# Patient Record
Sex: Male | Born: 1965 | Race: White | Hispanic: No | Smoking: Current every day smoker
Health system: Southern US, Community
[De-identification: ages and names within clinical notes are randomized; demographics above are authoritative.]

## PROBLEM LIST (undated history)

## (undated) DIAGNOSIS — N200 Calculus of kidney: Principal | ICD-10-CM

## (undated) DIAGNOSIS — I10 Essential (primary) hypertension: Secondary | ICD-10-CM

## (undated) DIAGNOSIS — Z87442 Personal history of urinary calculi: Secondary | ICD-10-CM

## (undated) HISTORY — PX: TIBIA FRACTURE SURGERY: SHX806

## (undated) HISTORY — PX: VASECTOMY: SHX75

## (undated) HISTORY — PX: APPENDECTOMY: SHX54

## (undated) HISTORY — DX: Calculus of kidney: N20.0

---

## 2003-11-03 ENCOUNTER — Emergency Department (HOSPITAL_COMMUNITY): Admission: EM | Admit: 2003-11-03 | Discharge: 2003-11-03 | Payer: Self-pay | Admitting: Emergency Medicine

## 2003-11-07 DIAGNOSIS — N200 Calculus of kidney: Secondary | ICD-10-CM

## 2003-11-07 HISTORY — DX: Calculus of kidney: N20.0

## 2003-12-14 ENCOUNTER — Emergency Department (HOSPITAL_COMMUNITY): Admission: EM | Admit: 2003-12-14 | Discharge: 2003-12-14 | Payer: Self-pay

## 2004-06-16 ENCOUNTER — Emergency Department (HOSPITAL_COMMUNITY): Admission: EM | Admit: 2004-06-16 | Discharge: 2004-06-16 | Payer: Self-pay | Admitting: Emergency Medicine

## 2009-06-11 ENCOUNTER — Ambulatory Visit (HOSPITAL_BASED_OUTPATIENT_CLINIC_OR_DEPARTMENT_OTHER): Admission: RE | Admit: 2009-06-11 | Discharge: 2009-06-11 | Payer: Self-pay | Admitting: Orthopedic Surgery

## 2011-02-11 LAB — POCT HEMOGLOBIN-HEMACUE: Hemoglobin: 14.7 g/dL (ref 13.0–17.0)

## 2011-03-21 NOTE — Op Note (Signed)
[name], Adam Mcguire                  ACCOUNT NO.:  [id number]   MEDICAL RECORD NO.:  [id number]          PATIENT TYPE:  AMB   LOCATION:  [facility]                         FACILITY:  [facility]   PHYSICIAN:  Marlowe Kays, M.D.  DATE OF BIRTH:  01-Dec-1965   DATE OF PROCEDURE:  06/11/2009  DATE OF DISCHARGE:                               OPERATIVE REPORT   PREOPERATIVE DIAGNOSES:  Torn medial meniscus, left knee.   POSTOPERATIVE DIAGNOSES:  1. Torn medial meniscus.  2. Multiple medial shelf plicae, left knee.   OPERATION:  1. Left knee arthroscopy with partial medial meniscectomy.  2. Resection of multiple suprapatellar plicae.   SURGEON:  Marlowe Kays, M.D.   ASSISTANT:  Nurse.   ANESTHESIA:  General.   PATHOLOGY AND JUSTIFICATION FOR PROCEDURE:  He injured his knee while  working for the [employer] on Mar 08, 2009 and an MRI of his left  knee performed on Mar 18, 2009, which showed a posterior horn tear of  the medial meniscus.  Because of some mechanical symptoms and pain, he  is here today for the above-mentioned surgery.   PROCEDURE:  Satisfactory anesthesia, Ace wrap and knee support to right  lower extremity.  Pneumatic tourniquet applied to left lower extremity  and the leg esmarched out nonsterilely and tourniquet inflated to 350  mmHg.  Thigh stabilizer applied.  Left leg was prepped with DuraPrep  from tourniquet to perform a stabilizer to ankle and draped in sterile  field.  Time-out performed.  Superior medial saline inflow first through  an anterolateral portal and medial compartment joint was evaluated and  this was completely normal.  Looking at the suprapatellar area, patella  looked unremarkable but he had three fairly significant plicae which I  pictured, sectioned with scissors with pictures of this being taken and  removed the remnants with 3.5 shaver.  On reversing portals in the  medial compartment knee joint, he had what may have been originally a  bucket-handle type tear with an intercondylar fragment and also a  disruption of the meniscus at the junction of the mid posterior third.  I resected most of the torn areas with baskets and then shaved the  remainder of the rim down with a 3.5 shaver until smooth on probing.  Final pictures were taken.  The joint was then irrigated to clear and  all fluid possible removed.  To close to anterior portals with 4-0 nylon  and then injected 20 mL 0.5% Marcaine with adrenaline and 4 mg of  morphine through the inflow apparatus which I removed  and then closed this portal with 4-0 nylon as well.  Betadine and  Adaptic dressing were applied.  He was given Toradol IV.  Tourniquet was  released.  He tolerated the procedure well and was taken to the recovery  room satisfactory this with no known complications.           ______________________________  Marlowe Kays, M.D.     JA/MEDQ  D:  06/11/2009  T:  06/12/2009  Job:  [id number]

## 2011-12-06 ENCOUNTER — Ambulatory Visit: Payer: 59

## 2011-12-06 ENCOUNTER — Ambulatory Visit (INDEPENDENT_AMBULATORY_CARE_PROVIDER_SITE_OTHER): Payer: 59 | Admitting: Family Medicine

## 2011-12-06 VITALS — BP 130/90 | HR 80 | Temp 98.1°F | Resp 24 | Ht 66.0 in | Wt 175.0 lb

## 2011-12-06 DIAGNOSIS — R300 Dysuria: Secondary | ICD-10-CM

## 2011-12-06 DIAGNOSIS — N200 Calculus of kidney: Secondary | ICD-10-CM | POA: Insufficient documentation

## 2011-12-06 LAB — POCT URINALYSIS DIPSTICK
Bilirubin, UA: NEGATIVE
Glucose, UA: NEGATIVE
Ketones, UA: NEGATIVE
Leukocytes, UA: NEGATIVE
Nitrite, UA: NEGATIVE
Protein, UA: NEGATIVE
Spec Grav, UA: 1.02
Urobilinogen, UA: 2
pH, UA: 7

## 2011-12-06 LAB — POCT UA - MICROSCOPIC ONLY
Casts, Ur, LPF, POC: NEGATIVE
Crystals, Ur, HPF, POC: NEGATIVE
Yeast, UA: NEGATIVE

## 2011-12-06 MED ORDER — HYDROMORPHONE HCL 2 MG PO TABS: 2.0000 mg | ORAL_TABLET | ORAL | Status: AC | PRN

## 2011-12-06 MED ORDER — KETOROLAC TROMETHAMINE 60 MG/2ML IM SOLN
60.0000 mg | Freq: Once | INTRAMUSCULAR | Status: AC
  Administered 2011-12-06: 60 mg via INTRAMUSCULAR

## 2011-12-06 MED ORDER — TAMSULOSIN HCL 0.4 MG PO CAPS: 0.4000 mg | ORAL_CAPSULE | Freq: Every day | ORAL | Status: DC

## 2011-12-06 NOTE — Progress Notes (Signed)
Quick Note:  Treating provider reviewed results. ______

## 2011-12-06 NOTE — Patient Instructions (Signed)
Kidney Stones Kidney stones (ureteral lithiasis) are deposits that form inside your kidneys. The intense pain is caused by the stone moving through the urinary tract. When the stone moves, the ureter goes into spasm around the stone. The stone is usually passed in the urine.  CAUSES   A disorder that makes certain neck glands produce too much parathyroid hormone (primary hyperparathyroidism).   A buildup of uric acid crystals.   Narrowing (stricture) of the ureter.   A kidney obstruction present at birth (congenital obstruction).   Previous surgery on the kidney or ureters.   Numerous kidney infections.  SYMPTOMS   Feeling sick to your stomach (nauseous).   Throwing up (vomiting).   Blood in the urine (hematuria).   Pain that usually spreads (radiates) to the groin.   Frequency or urgency of urination.  DIAGNOSIS   Taking a history and physical exam.   Blood or urine tests.   Computerized X-ray scan (CT scan).   Occasionally, an examination of the inside of the urinary bladder (cystoscopy) is performed.  TREATMENT   Observation.   Increasing your fluid intake.   Surgery may be needed if you have severe pain or persistent obstruction.  The size, location, and chemical composition are all important variables that will determine the proper choice of action for you. Talk to your caregiver to better understand your situation so that you will minimize the risk of injury to yourself and your kidney.  HOME CARE INSTRUCTIONS   Drink enough water and fluids to keep your urine clear or pale yellow.   Strain all urine through the provided strainer. Keep all particulate matter and stones for your caregiver to see. The stone causing the pain may be as small as a grain of salt. It is very important to use the strainer each and every time you pass your urine. The collection of your stone will allow your caregiver to analyze it and verify that a stone has actually passed.   Only take  over-the-counter or prescription medicines for pain, discomfort, or fever as directed by your caregiver.   Make a follow-up appointment with your caregiver as directed.   Get follow-up X-rays if required. The absence of pain does not always mean that the stone has passed. It may have only stopped moving. If the urine remains completely obstructed, it can cause loss of kidney function or even complete destruction of the kidney. It is your responsibility to make sure X-rays and follow-ups are completed. Ultrasounds of the kidney can show blockages and the status of the kidney. Ultrasounds are not associated with any radiation and can be performed easily in a matter of minutes.  SEEK IMMEDIATE MEDICAL CARE IF:   Pain cannot be controlled with the prescribed medicine.   You have a fever.   The severity or intensity of pain increases over 18 hours and is not relieved by pain medicine.   You develop a new onset of abdominal pain.   You feel faint or pass out.  MAKE SURE YOU:   Understand these instructions.   Will watch your condition.   Will get help right away if you are not doing well or get worse.  Document Released: 10/23/2005 Document Revised: 07/05/2011 Document Reviewed: 02/18/2010 ExitCare Patient Information 2012 [employer].Ureteral Colic Ureteral colic is spasm-like pain from the kidney or the ureter. This is often caused by a kidney stone. The pain is caused by the stone trying to get through the tubes that pass your pee. HOME   CARE   Drink enough fluids to keep your pee (urine) clear or pale yellow.   Strain all your pee. A strainer will be provided. Keep anything caught in the strainer and bring it to your doctor. The stone causing the pain may be very small.   Only take medicine as told by your doctor.   Follow up with your doctor as told.  GET HELP RIGHT AWAY IF:   Pain is not controlled with medicine.   Pain continues or gets worse.   The pain changes and there  is chest or belly (abdominal) pain.   You pass out (faint).   You cannot pee.   You keep throwing up (vomiting).   You have a temperature by mouth above 102 F (38.9 C), not controlled by medicine.  MAKE SURE YOU:   Understand these instructions.   Will watch this condition.   Will get help right away if you are not doing well or get worse.  Document Released: 04/10/2008 Document Revised: 07/05/2011 Document Reviewed: 04/10/2008 ExitCare Patient Information 2012 [employer]. 

## 2011-12-06 NOTE — Progress Notes (Signed)
This is a 46 yo [employer] [address] employee with acute right flank pain thought to be a kidney stone since he's had this before.  Pain began 3 days ago and has persisted, much worse today.  Couldn't put shoes on this am.  No nausea or vomiting.  Lost appetite.  Took some Bayer aspirin but it hasn't helped.  Had prior stone in about 2005.  Seen by [facility].  O: Pt lying in obvious pain on right side.  Alert and cooperative.  Positive SLR both legs while supine.  Mild right CVA tenderness.  Nontender abdomen.   KUB negative.  U/A shows 10-15 rbc/hpf  A:  Kidney stone, acute\ P:  F/u [facility]  Dilaudid for pain  Flomax  Urine strainer provided.

## 2014-05-19 ENCOUNTER — Emergency Department (HOSPITAL_COMMUNITY): Payer: Worker's Compensation

## 2014-05-19 ENCOUNTER — Emergency Department (HOSPITAL_COMMUNITY)
Admission: EM | Admit: 2014-05-19 | Discharge: 2014-05-19 | Disposition: A | Discharge status: Home / Self Care | Payer: Worker's Compensation | Attending: Emergency Medicine | Admitting: Emergency Medicine

## 2014-05-19 ENCOUNTER — Encounter (HOSPITAL_COMMUNITY): Payer: Self-pay | Admitting: Emergency Medicine

## 2014-05-19 DIAGNOSIS — S61209A Unspecified open wound of unspecified finger without damage to nail, initial encounter: Secondary | ICD-10-CM | POA: Insufficient documentation

## 2014-05-19 DIAGNOSIS — Z7982 Long term (current) use of aspirin: Secondary | ICD-10-CM | POA: Insufficient documentation

## 2014-05-19 DIAGNOSIS — Y929 Unspecified place or not applicable: Secondary | ICD-10-CM | POA: Insufficient documentation

## 2014-05-19 DIAGNOSIS — W268XXA Contact with other sharp object(s), not elsewhere classified, initial encounter: Secondary | ICD-10-CM | POA: Insufficient documentation

## 2014-05-19 DIAGNOSIS — S61219A Laceration without foreign body of unspecified finger without damage to nail, initial encounter: Secondary | ICD-10-CM

## 2014-05-19 DIAGNOSIS — Y9389 Activity, other specified: Secondary | ICD-10-CM | POA: Insufficient documentation

## 2014-05-19 DIAGNOSIS — F17200 Nicotine dependence, unspecified, uncomplicated: Secondary | ICD-10-CM | POA: Insufficient documentation

## 2014-05-19 DIAGNOSIS — Z87442 Personal history of urinary calculi: Secondary | ICD-10-CM | POA: Insufficient documentation

## 2014-05-19 DIAGNOSIS — Z79899 Other long term (current) drug therapy: Secondary | ICD-10-CM | POA: Insufficient documentation

## 2014-05-19 MED ORDER — OXYCODONE-ACETAMINOPHEN 5-325 MG PO TABS: 1.0000 | ORAL_TABLET | Freq: Four times a day (QID) | ORAL | Status: DC | PRN

## 2014-05-19 NOTE — Progress Notes (Signed)
Orthopedic Tech Progress Note Patient Details:  Adam JanskyRandy L Kalis 06/02/1966 [id number]  Ortho Devices Type of Ortho Device: Finger splint Ortho Device/Splint Location: lue Ortho Device/Splint Interventions: Application   Terrence Pizana 05/19/2014, 6:07 PM

## 2014-05-19 NOTE — ED Provider Notes (Signed)
CSN: [id number]     Arrival date & time 05/19/14  1509 History  This chart was [id number] for non-physician practitioner, Allen DerryMercedes Camprubi-Soms PA-C working with [name], MD by Arlan OrganAshley Leger, ED scribe. This patient was seen in room TR08C/TR08C and the patient's care was started at 4:22 PM.   Chief Complaint  Patient presents with  . Laceration   Patient is a 48 y.o. male presenting with skin laceration. The history is provided by the patient. No language interpreter was used.  Laceration Location:  Finger Finger laceration location:  L index finger Length (cm):  3 Depth:  Cutaneous Quality: jagged   Bleeding: controlled   Time since incident:  3 hours Laceration mechanism:  Metal edge Pain details:    Quality:  Throbbing   Severity:  Moderate   Timing:  Constant   Progression:  Unchanged Foreign body present:  No foreign bodies Relieved by:  Certain positions Worsened by:  Movement Tetanus status:  Up to date  HPI Comments: Adam JanskyRandy L Mcguire is a 48 y.o. male who presents to the Emergency Department complaining of a laceration to his L 2nd digit sustained earlier today around 1:30 PM. Pt states he was punching a hole in something resulting in him cutting his finger on a piece of stainless steel. He describes associated pain as throbbing, moderate, non-radiating.  This pain is exacerbated with movement. He has attempted to hold his hand over his head to help alleviate discomfort. No pain medications used to improve symptoms. Bleeding controlled with pressure PTA. At this time he denies any fever, chills, numbness, loss of sensation, or paresthesia. Pt Tetanus UTD by urgent care earlier today. He is otherwise healthy without any medical problems. No other concerns this visit. Works with his hands, has 3 jobs.  Past Medical History  Diagnosis Date  . Kidney stone 2005   Past Surgical History  Procedure Laterality Date  . Appendectomy     No family history on file. History   Substance Use Topics  . Smoking status: Current Every Day Smoker -- 0.50 packs/day for 30 years    Types: Cigarettes  . Smokeless tobacco: Not on file  . Alcohol Use: Not on file    Review of Systems  Constitutional: Negative for fever and chills.  Skin: Positive for wound (Laceration to L 2nd digit). Negative for color change.  Neurological: Negative for weakness and numbness.   Allergies  Vicodin  Home Medications   Prior to Admission medications   Medication Sig Start Date End Date Taking? Authorizing Provider  aspirin 500 MG buffered tablet Take 500 mg by mouth every 6 (six) hours as needed for pain.    Yes Historical Provider, MD  Tamsulosin HCl (FLOMAX) 0.4 MG CAPS Take 1 capsule (0.4 mg total) by mouth daily. 12/06/11  Yes Elvina SidleKurt Lauenstein, MD  oxyCODONE-acetaminophen (PERCOCET) 5-325 MG per tablet Take 1-2 tablets by mouth every 6 (six) hours as needed for severe pain. 05/19/14   Kutter Schnepf Strupp Camprubi-Soms, PA-C   Triage Vitals: BP 135/88  Pulse 60  Temp(Src) 98 F (36.7 C) (Oral)  Resp 18  Ht 5\' 7"  (1.702 m)  Wt 179 lb (81.194 kg)  BMI 28.03 kg/m2  SpO2 99%    Physical Exam  Nursing note and vitals reviewed. Constitutional: He is oriented to person, place, and time. Vital signs are normal. He appears well-developed and well-nourished.  Afebrile, NAD  HENT:  Head: Normocephalic and atraumatic.  Mouth/Throat: Mucous membranes are normal.  Eyes: Conjunctivae, EOM  and lids are normal.  Neck: Normal range of motion. Neck supple.  Cardiovascular: Normal rate and intact distal pulses.   Distal pulses intact in all extremities, cap refill <3 sec in all digits including L index finger  Pulmonary/Chest: Effort normal.  Abdominal: Normal appearance. He exhibits no distension.  Musculoskeletal: Normal range of motion.  ROM intact in all joints of BUEs. MCP, PIP, and DIP joints with FROM. Neurovascularly intact in BUEs and all digits. No bony TTP in L index finger.   Neurological: He is alert and oriented to person, place, and time. He has normal strength. No sensory deficit.  Sensation intact in all extremities and digits, strength 5/5 in all extremities. ROM intact. Good cap refill and pulses  Skin: Skin is warm and dry. Laceration noted.  5cm jagged Lac to L index finger tip with flap slightly pale, bleeding well controlled. Neurovascularly intact distal tip.   Psychiatric: He has a normal mood and affect.    ED Course  LACERATION REPAIR Date/Time: 05/20/2014 5:15 PM Performed by: CAMPRUBI-SOMS, Tivon Lemoine STRUPP Authorized by: Ramond Marrow Consent: Verbal consent obtained. Risks and benefits: risks, benefits and alternatives were discussed Consent given by: patient Patient understanding: patient states understanding of the procedure being performed Patient consent: the patient's understanding of the procedure matches consent given Patient identity confirmed: verbally with patient Body area: upper extremity Location details: left index finger Laceration length: 5 cm Foreign bodies: no foreign bodies Tendon involvement: none Nerve involvement: none Vascular damage: no Anesthesia: local infiltration Local anesthetic: lidocaine 2% without epinephrine Anesthetic total: 1.5 ml Patient sedated: no Preparation: Patient was prepped and draped in the usual sterile fashion. Irrigation solution: saline Irrigation method: jet lavage Amount of cleaning: standard Debridement: none Degree of undermining: none Skin closure: 4-0 Prolene Number of sutures: 7 Technique: simple Approximation: close Approximation difficulty: simple Dressing: 4x4 sterile gauze, antibiotic ointment and splint Patient tolerance: Patient tolerated the procedure well with no immediate complications.   (including critical care time)  DIAGNOSTIC STUDIES: Oxygen Saturation is 99% on RA, normal by my interpretation.    COORDINATION OF CARE: 4:27 PM- Will  order DG Finger Index L. Discussed treatment plan with pt at bedside and pt agreed to plan.    5:15 PM- Will perform laceration repair.   Labs Review Labs Reviewed - No data to display  Imaging Review Dg Finger Index Left  05/19/2014   CLINICAL DATA:  Recent injury with skin laceration  EXAM: LEFT INDEX FINGER 2+V  COMPARISON:  None.  FINDINGS: Soft tissue injury is noted consistent with the given clinical history. The underlying bony structures are within normal limits. No radiopaque foreign body is seen.  IMPRESSION: Soft tissue injury without acute bony abnormality.   Electronically Signed   By: Alcide Clever M.D.   On: 05/19/2014 17:01     EKG Interpretation None      MDM   Final diagnoses:  Finger laceration, initial encounter    Adam Mcguire is a 48 y.o. male with no significant PMHx presenting after cutting L index finger tip with metallic object at work. Seen by UC and given tetanus, sent to [place] for further eval and suturing. Xray neg, neurovascularly intact, no tendon involvement, ROM intact in all joints. Sutured with 4-0 prolene, applied dressing and finger tip splint to help with pain relief and avoid injury at work given that pt states he works with his hands and will not likely stay out of work regardless of being given a  note. Discussed suture care, signs of infection, and returning to UC or PCP in 7 days for suture removal. Rx for pain meds, no need for abx at this time but given strict return precautions. I explained the diagnosis and have given explicit precautions to return to the ER including for any other new or worsening symptoms. The patient understands and accepts the medical plan as it's been dictated and I have answered their questions. Discharge instructions concerning home care and prescriptions have been given. The patient is STABLE and is discharged to home in good condition.  I personally performed the services described in this documentation, which was scribed  in my presence. The recorded information has been reviewed and is accurate.  BP 135/88  Pulse 60  Temp(Src) 98 F (36.7 C) (Oral)  Resp 18  Ht 5\' 7"  (1.702 m)  Wt 179 lb (81.194 kg)  BMI 28.03 kg/m2  SpO2 99%   [employer], PA-C 05/20/14 0354

## 2014-05-19 NOTE — ED Notes (Signed)
Pt has sm. Flap laceration to left index finger.  No bleeding at this time

## 2014-05-19 NOTE — ED Notes (Signed)
Finger laceration lt. Index finger tip. Bleeding controlled.

## 2014-05-19 NOTE — Discharge Instructions (Signed)
Keep wound and clean with mild soap and water. Keep area covered with a topical antibiotic ointment and bandage, keep bandage dry, and do not submerge in water for 24 hours. Ice and elevate for additional pain relief and swelling. Alternate between Ibuprofen and Percocet for additional pain relief. Follow up with your primary care doctor or the [facility] in approximately 7 days for wound recheck and suture removal. Monitor area for signs of infection to include, but not limited to: increasing pain, redness, drainage/pus, or swelling. Return to emergency department for emergent changing or worsening symptoms.   Fingertip Injuries and Amputations Fingertip injuries are common and often get injured because they are last to escape when pulling your hand out of harm's way. You have amputated (cut off) part of your finger. How this turns out depends largely on how much was amputated. If just the tip is amputated, often the end of the finger will grow back and the finger may return to much the same as it was before the injury.  If more of the finger is missing, your caregiver has done the best with the tissue remaining to allow you to keep as much finger as is possible. Your caregiver after checking your injury has tried to leave you with a painless fingertip that has durable, feeling skin. If possible, your caregiver has tried to maintain the finger's length and appearance and preserve its fingernail.  Please read the instructions outlined below and refer to this sheet in the next few weeks. These instructions provide you with general information on caring for yourself. Your caregiver may also give you specific instructions. While your treatment has been done according to the most current medical practices available, unavoidable complications occasionally occur. If you have any problems or questions after discharge, please call your caregiver. HOME CARE INSTRUCTIONS   You may resume normal diet  and activities as directed or allowed.  Keep your hand elevated above the level of your heart. This helps decrease pain and swelling.  Keep ice packs (or a bag of ice wrapped in a towel) on the injured area for 15-20 minutes, 03-04 times per day, for the first two days.  Change dressings if necessary or as directed.  Clean the wound daily or as directed.  Only take over-the-counter or prescription medicines for pain, discomfort, or fever as directed by your caregiver.  Keep appointments as directed. SEEK IMMEDIATE MEDICAL CARE IF:  You develop redness, swelling, numbness or increasing pain in the wound.  There is pus coming from the wound.  You develop an unexplained oral temperature above 102 F (38.9 C) or as your caregiver suggests.  There is a foul (bad) smell coming from the wound or dressing.  There is a breaking open of the wound (edges not staying together) after sutures or staples have been removed. MAKE SURE YOU:   Understand these instructions.  Will watch your condition.  Will get help right away if you are not doing well or get worse. Document Released: 09/13/2005 Document Revised: 01/15/2012 Document Reviewed: 08/12/2008 [facility] Patient Information 2015 [place], [place]. This information is not intended to replace advice given to you by your health care provider. Make sure you discuss any questions you have with your health care provider.  Laceration Care, Adult A laceration is a cut or lesion that goes through all layers of the skin and into the tissue just beneath the skin. TREATMENT  Some lacerations may not require closure. Some lacerations may not be  able to be closed due to an increased risk of infection. It is important to see your caregiver as soon as possible after an injury to minimize the risk of infection and maximize the opportunity for successful closure. If closure is appropriate, pain medicines may be given, if needed. The wound will be cleaned to  help prevent infection. Your caregiver will use stitches (sutures), staples, wound glue (adhesive), or skin adhesive strips to repair the laceration. These tools bring the skin edges together to allow for faster healing and a better cosmetic outcome. However, all wounds will heal with a scar. Once the wound has healed, scarring can be minimized by covering the wound with sunscreen during the day for 1 full year. HOME CARE INSTRUCTIONS  For sutures or staples:  Keep the wound clean and dry.  If you were given a bandage (dressing), you should change it at least once a day. Also, change the dressing if it becomes wet or dirty, or as directed by your caregiver.  Wash the wound with soap and water 2 times a day. Rinse the wound off with water to remove all soap. Pat the wound dry with a clean towel.  After cleaning, apply a thin layer of the antibiotic ointment as recommended by your caregiver. This will help prevent infection and keep the dressing from sticking.  You may shower as usual after the first 24 hours. Do not soak the wound in water until the sutures are removed.  Only take over-the-counter or prescription medicines for pain, discomfort, or fever as directed by your caregiver.  Get your sutures or staples removed as directed by your caregiver. For skin adhesive strips:  Keep the wound clean and dry.  Do not get the skin adhesive strips wet. You may bathe carefully, using caution to keep the wound dry.  If the wound gets wet, pat it dry with a clean towel.  Skin adhesive strips will fall off on their own. You may trim the strips as the wound heals. Do not remove skin adhesive strips that are still stuck to the wound. They will fall off in time. For wound adhesive:  You may briefly wet your wound in the shower or bath. Do not soak or scrub the wound. Do not swim. Avoid periods of heavy perspiration until the skin adhesive has fallen off on its own. After showering or bathing, gently  pat the wound dry with a clean towel.  Do not apply liquid medicine, cream medicine, or ointment medicine to your wound while the skin adhesive is in place. This may loosen the film before your wound is healed.  If a dressing is placed over the wound, be careful not to apply tape directly over the skin adhesive. This may cause the adhesive to be pulled off before the wound is healed.  Avoid prolonged exposure to sunlight or tanning lamps while the skin adhesive is in place. Exposure to ultraviolet light in the first year will darken the scar.  The skin adhesive will usually remain in place for 5 to 10 days, then naturally fall off the skin. Do not pick at the adhesive film. You may need a tetanus shot if:  You cannot remember when you had your last tetanus shot.  You have never had a tetanus shot. If you get a tetanus shot, your arm may swell, get red, and feel warm to the touch. This is common and not a problem. If you need a tetanus shot and you choose not to have  one, there is a rare chance of getting tetanus. Sickness from tetanus can be serious. SEEK MEDICAL CARE IF:   You have redness, swelling, or increasing pain in the wound.  You see a red line that goes away from the wound.  You have yellowish-white fluid (pus) coming from the wound.  You have a fever.  You notice a bad smell coming from the wound or dressing.  Your wound breaks open before or after sutures have been removed.  You notice something coming out of the wound such as wood or glass.  Your wound is on your hand or foot and you cannot move a finger or toe. SEEK IMMEDIATE MEDICAL CARE IF:   Your pain is not controlled with prescribed medicine.  You have severe swelling around the wound causing pain and numbness or a change in color in your arm, hand, leg, or foot.  Your wound splits open and starts bleeding.  You have worsening numbness, weakness, or loss of function of any joint around or beyond the  wound.  You develop painful lumps near the wound or on the skin anywhere on your body. MAKE SURE YOU:   Understand these instructions.  Will watch your condition.  Will get help right away if you are not doing well or get worse. Document Released: 10/23/2005 Document Revised: 01/15/2012 Document Reviewed: 04/18/2011 [facility] Patient Information 2015 [place], [place]. This information is not intended to replace advice given to you by your health care provider. Make sure you discuss any questions you have with your health care provider.

## 2014-05-23 NOTE — ED Provider Notes (Signed)
Medical screening examination/treatment/procedure(s) were performed by non-physician practitioner and as supervising physician I was immediately available for consultation/collaboration.   EKG Interpretation None        Adam Mcguire CoKevin M Nicholai Willette, MD 05/23/14 [phone]

## 2018-08-20 ENCOUNTER — Emergency Department (HOSPITAL_COMMUNITY)
Admission: EM | Admit: 2018-08-20 | Discharge: 2018-08-20 | Disposition: A | Discharge status: Home / Self Care | Payer: 59 | Attending: Emergency Medicine | Admitting: Emergency Medicine

## 2018-08-20 ENCOUNTER — Emergency Department (HOSPITAL_COMMUNITY): Payer: 59

## 2018-08-20 ENCOUNTER — Encounter (HOSPITAL_COMMUNITY): Payer: Self-pay | Admitting: Emergency Medicine

## 2018-08-20 ENCOUNTER — Other Ambulatory Visit: Payer: Self-pay

## 2018-08-20 DIAGNOSIS — R110 Nausea: Secondary | ICD-10-CM | POA: Diagnosis not present

## 2018-08-20 DIAGNOSIS — Z7982 Long term (current) use of aspirin: Secondary | ICD-10-CM | POA: Insufficient documentation

## 2018-08-20 DIAGNOSIS — Z733 Stress, not elsewhere classified: Secondary | ICD-10-CM | POA: Insufficient documentation

## 2018-08-20 DIAGNOSIS — R079 Chest pain, unspecified: Secondary | ICD-10-CM | POA: Diagnosis present

## 2018-08-20 DIAGNOSIS — R002 Palpitations: Secondary | ICD-10-CM

## 2018-08-20 DIAGNOSIS — R911 Solitary pulmonary nodule: Secondary | ICD-10-CM

## 2018-08-20 DIAGNOSIS — F17210 Nicotine dependence, cigarettes, uncomplicated: Secondary | ICD-10-CM | POA: Insufficient documentation

## 2018-08-20 LAB — CBC WITH DIFFERENTIAL/PLATELET
Abs Immature Granulocytes: 0.01 10*3/uL (ref 0.00–0.07)
Basophils Absolute: 0.1 10*3/uL (ref 0.0–0.1)
Basophils Relative: 1 %
Eosinophils Absolute: 0.5 10*3/uL (ref 0.0–0.5)
Eosinophils Relative: 6 %
HCT: 42.8 % (ref 39.0–52.0)
Hemoglobin: 13.6 g/dL (ref 13.0–17.0)
Immature Granulocytes: 0 %
Lymphocytes Relative: 33 %
Lymphs Abs: 2.6 10*3/uL (ref 0.7–4.0)
MCH: 26.2 pg (ref 26.0–34.0)
MCHC: 31.8 g/dL (ref 30.0–36.0)
MCV: 82.5 fL (ref 80.0–100.0)
Monocytes Absolute: 0.5 10*3/uL (ref 0.1–1.0)
Monocytes Relative: 6 %
Neutro Abs: 4.2 10*3/uL (ref 1.7–7.7)
Neutrophils Relative %: 54 %
Platelets: 229 10*3/uL (ref 150–400)
RBC: 5.19 MIL/uL (ref 4.22–5.81)
RDW: 13.9 % (ref 11.5–15.5)
WBC: 7.8 10*3/uL (ref 4.0–10.5)
nRBC: 0 % (ref 0.0–0.2)

## 2018-08-20 LAB — COMPREHENSIVE METABOLIC PANEL
ALT: 23 U/L (ref 0–44)
AST: 21 U/L (ref 15–41)
Albumin: 4.1 g/dL (ref 3.5–5.0)
Alkaline Phosphatase: 54 U/L (ref 38–126)
Anion gap: 7 (ref 5–15)
BUN: 12 mg/dL (ref 6–20)
CO2: 26 mmol/L (ref 22–32)
Calcium: 9.5 mg/dL (ref 8.9–10.3)
Chloride: 107 mmol/L (ref 98–111)
Creatinine, Ser: 1.19 mg/dL (ref 0.61–1.24)
GFR calc Af Amer: >60 mL/min (ref >60)
GFR calc non Af Amer: >60 mL/min (ref >60)
Glucose, Bld: 117 mg/dL — ABNORMAL HIGH (ref 70–99)
Potassium: 3.7 mmol/L (ref 3.5–5.1)
Sodium: 140 mmol/L (ref 135–145)
Total Bilirubin: 0.5 mg/dL (ref 0.3–1.2)
Total Protein: 7.1 g/dL (ref 6.5–8.1)

## 2018-08-20 LAB — I-STAT TROPONIN, ED
Troponin i, poc: 0 ng/mL (ref 0.00–0.08)
Troponin i, poc: 0 ng/mL (ref 0.00–0.08)

## 2018-08-20 MED ORDER — IOPAMIDOL (ISOVUE-370) INJECTION 76%
INTRAVENOUS | Status: AC
  Filled 2018-08-20: qty 100

## 2018-08-20 MED ORDER — IOPAMIDOL (ISOVUE-370) INJECTION 76%
100.0000 mL | Freq: Once | INTRAVENOUS | Status: AC | PRN
  Administered 2018-08-20: 100 mL via INTRAVENOUS

## 2018-08-20 NOTE — ED Provider Notes (Signed)
[facility] EMERGENCY DEPARTMENT Provider Note   CSN: [id number] Arrival date & time: 08/20/18  1453     History   Chief Complaint Chief Complaint  Patient presents with  . Chest Pain    HPI Adam Mcguire is a 52 y.o. male.  The history is provided by the patient.  Chest Pain   This is a new problem. The current episode started 3 to 5 hours ago. The problem has been resolved. The pain is associated with eating. The pain is present in the substernal region. The pain is at a severity of 3/10. The pain is mild. Quality: palpitations. The pain does not radiate. Associated symptoms include nausea and palpitations. Pertinent negatives include no abdominal pain, no back pain, no cough, no exertional chest pressure, no fever, no hemoptysis, no leg pain, no malaise/fatigue, no near-syncope, no numbness, no orthopnea, no PND, no shortness of breath, no syncope, no vomiting and no weakness. He has tried rest for the symptoms.  Pertinent negatives for past medical history include no seizures.    Past Medical History:  Diagnosis Date  . Kidney stone 2005    Patient Active Problem List   Diagnosis Date Noted  . Kidney stone 12/06/2011    Past Surgical History:  Procedure Laterality Date  . APPENDECTOMY          Home Medications    Prior to Admission medications   Medication Sig Start Date End Date Taking? Authorizing Provider  aspirin 325 MG tablet Take 325 mg by mouth once.   Yes [provider]  oxyCODONE-acetaminophen (PERCOCET) 5-325 MG per tablet Take 1-2 tablets by mouth every 6 (six) hours as needed for severe pain. Patient not taking: Reported on 08/20/2018 05/19/14   Street, [employer], PA-C  Tamsulosin HCl (FLOMAX) 0.4 MG CAPS Take 1 capsule (0.4 mg total) by mouth daily. Patient not taking: Reported on 08/20/2018 12/06/11   Elvina Sidle, MD    Family History No family history on file.  Social History Social History   Tobacco Use  .  Smoking status: Current Every Day Smoker    Packs/day: 0.50    Years: 30.00    Pack years: 15.00    Types: Cigarettes  . Smokeless tobacco: Never Used  Substance Use Topics  . Alcohol use: Never    Frequency: Never  . Drug use: Never     Allergies   Vicodin [hydrocodone-acetaminophen]   Review of Systems Review of Systems  Constitutional: Negative for chills, fever and malaise/fatigue.  HENT: Negative for ear pain and sore throat.   Eyes: Negative for pain and visual disturbance.  Respiratory: Negative for cough, hemoptysis and shortness of breath.   Cardiovascular: Positive for chest pain and palpitations. Negative for orthopnea, syncope, PND and near-syncope.  Gastrointestinal: Positive for nausea. Negative for abdominal pain and vomiting.  Genitourinary: Negative for dysuria and hematuria.  Musculoskeletal: Negative for arthralgias and back pain.  Skin: Negative for color change and rash.  Neurological: Negative for seizures, syncope, weakness and numbness.  Psychiatric/Behavioral: The patient is nervous/anxious.   All other systems reviewed and are negative.    Physical Exam Updated Vital Signs  ED Triage Vitals  Enc Vitals Group     BP 08/20/18 1514 (!) 170/92     Pulse Rate 08/20/18 1514 61     Resp 08/20/18 1514 16     Temp 08/20/18 1514 98.7 F (37.1 C)     Temp Source 08/20/18 1514 Oral     SpO2  08/20/18 1514 98 %     Weight 08/20/18 1516 185 lb (83.9 kg)     Height 08/20/18 1516 5' 6.5" (1.689 m)     Head Circumference --      Peak Flow --      Pain Score 08/20/18 1515 2     Pain Loc --      Pain Edu? --      Excl. in GC? --     Physical Exam  Constitutional: He is oriented to person, place, and time. He appears well-developed and well-nourished.  HENT:  Head: Normocephalic and atraumatic.  Eyes: Pupils are equal, round, and reactive to light. Conjunctivae and EOM are normal.  Neck: Normal range of motion. Neck supple.  Cardiovascular: Normal  rate, regular rhythm, intact distal pulses and normal pulses.  No murmur heard. Pulmonary/Chest: Effort normal and breath sounds normal. No respiratory distress. He has no decreased breath sounds. He has no wheezes. He has no rhonchi. He has no rales.  Abdominal: Soft. Bowel sounds are normal. There is no tenderness.  Musculoskeletal: He exhibits no edema.       Right lower leg: He exhibits no edema.       Left lower leg: He exhibits no edema.  Neurological: He is alert and oriented to person, place, and time. No cranial nerve deficit.  Skin: Skin is warm and dry. Capillary refill takes less than 2 seconds.  Psychiatric: His mood appears anxious.  Nursing note and vitals reviewed.    ED Treatments / Results  Labs (all labs ordered are listed, but only abnormal results are displayed) Labs Reviewed  COMPREHENSIVE METABOLIC PANEL - Abnormal; Notable for the following components:      Result Value   Glucose, Bld 117 (*)    All other components within normal limits  CBC WITH DIFFERENTIAL/PLATELET  I-STAT TROPONIN, ED  I-STAT TROPONIN, ED    EKG EKG Interpretation  Date/Time:  Tuesday August 20 2018 14:59:08 EDT Ventricular Rate:  67 PR Interval:  132 QRS Duration: 90 QT Interval:  396 QTC Calculation: 418 R Axis:   60 Text Interpretation:  Normal sinus rhythm with sinus arrhythmia Normal ECG Confirmed by Virgina Norfolk [phone]) on 08/20/2018 4:41:15 PM   Radiology Dg Chest 2 View  Result Date: 08/20/2018 CLINICAL DATA:  Chest pain. EXAM: CHEST - 2 VIEW COMPARISON:  None. FINDINGS: The heart size and mediastinal contours are within normal limits. No pneumothorax or pleural effusion is noted. Right lung is clear. Rounded nodular density is seen projected over left midlung. The visualized skeletal structures are unremarkable. IMPRESSION: Rounded nodular density seen projected over left midlung. It is uncertain if this represents overlying lead marker or potentially pulmonary mass  or neoplasm. Repeat radiograph after removal of all markers is recommended, or potentially CT scan of the chest for further evaluation. Electronically Signed   By: Lupita Raider, M.D.   On: 08/20/2018 16:28   Ct Angio Chest Pe W And/or Wo Contrast  Result Date: 08/20/2018 CLINICAL DATA:  Left-sided chest pain. EXAM: CT ANGIOGRAPHY CHEST WITH CONTRAST TECHNIQUE: Multidetector CT imaging of the chest was performed using the standard protocol during bolus administration of intravenous contrast. Multiplanar CT image reconstructions and MIPs were obtained to evaluate the vascular anatomy. CONTRAST:  ISOVUE-370 IOPAMIDOL (ISOVUE-370) INJECTION 76% COMPARISON:  Chest x-ray from same day. FINDINGS: Cardiovascular: Satisfactory opacification of the pulmonary arteries to the segmental level. No evidence of pulmonary embolism. Borderline cardiomegaly. No pericardial effusion. Normal caliber thoracic aorta.  Calcific atherosclerosis of the left anterior descending coronary artery. Mediastinum/Nodes: No enlarged mediastinal, hilar, or axillary lymph nodes. Prominent bilateral subcentimeter hilar lymph nodes measuring up to 10 mm in short axis are likely reactive. Thyroid gland, trachea, and esophagus demonstrate no significant findings. Lungs/Pleura: Diffuse moderate peribronchial thickening. No focal consolidation, pleural effusion, or pneumothorax. 3 mm pulmonary nodule in the right upper lobe (series 6, image 34). Upper Abdomen: No acute abnormality. Musculoskeletal: No chest wall abnormality. No acute or significant osseous findings. Mild chronic wedging of several thoracic vertebral bodies. Review of the MIP images confirms the above findings. IMPRESSION: 1.  No evidence of pulmonary embolism. 2. Diffuse moderate peribronchial thickening, likely smoking-related. 3. 3 mm right upper lobe pulmonary nodule. No follow-up needed if patient is low-risk. Non-contrast chest CT can be considered in 12 months if patient  is high-risk. This recommendation follows the consensus statement: Guidelines for Management of Incidental Pulmonary Nodules Detected on CT Images: From the Fleischner Society 2017; Radiology 2017; 284:228-243. Electronically Signed   By: Obie Dredge M.D.   On: 08/20/2018 18:28    Procedures Procedures (including critical care time)  Medications Ordered in ED Medications  iopamidol (ISOVUE-370) 76 % injection (has no administration in time range)  iopamidol (ISOVUE-370) 76 % injection 100 mL (100 mLs Intravenous Contrast Given 08/20/18 1754)     Initial Impression / Assessment and Plan / ED Course  I have reviewed the triage vital signs and the nursing notes.  Pertinent labs & imaging results that were available during my care of the patient were reviewed by me and considered in my medical decision making (see chart for details).     Adam Mcguire is a 52 year old male with no significant medical problems who presents to the ED with chest pain, palpitations.  Patient with unremarkable vitals.  No fever.  Patient mostly describing palpitations for the last several hours.  States that has been on and off for the last several days.  Denies any true chest pain upon further discussion.  No shortness of breath.  Has felt a lot of anxiety and stress from his job recently.  Has no significant family history of coronary artery disease.  No DVT or PE risk factors.  Doubt PE.  Patient is overall well-appearing.  EKG shows sinus rhythm with no signs of ischemic changes.  Patient with overall unremarkable exam.  Clear breath sounds.  No signs of volume overload.  No obvious murmur.  Chest x-ray showed possible pulmonary nodule.  No pneumonia, no pneumothorax, no pleural effusion.  CT PE study was ordered to evaluate for pulmonary nodule and to further elucidate any PE.  Patient otherwise with no significant anemia, electrolyte abnormality, kidney injury.  Troponins negative x2.  PET scan showed no acute PE  but did show a 3 mm pulmonary nodule.  Suspect patient likely suffering from palpitations, possibly PVCs.  Recommend that he follow-up with primary care doctor for likely heart monitor.  We will also need CT scan of his chest in 1 year to further evaluate pulmonary nodule.  He is aware of these findings.  Doubt acute coronary process at this time.  Given return precautions.  Discharged from ED in good condition.  This chart was dictated using voice recognition software.  Despite best efforts to proofread,  errors can occur which can change the documentation meaning.   Final Clinical Impressions(s) / ED Diagnoses   Final diagnoses:  Pulmonary nodule  Palpitations    ED Discharge Orders  None       Virgina Norfolk, DO 08/20/18 2019

## 2018-08-20 NOTE — ED Triage Notes (Signed)
Pt to ED with c/o left chest pain.  Nausea and diaphoresis st's pain comes and goes.  Pt took ASA 324mg  prior to arrival

## 2018-08-20 NOTE — ED Notes (Signed)
Patient able to ambulate independently  

## 2018-08-20 NOTE — ED Notes (Signed)
Pt transported to CT ?

## 2018-08-20 NOTE — ED Provider Notes (Signed)
Patient placed in Quick Look pathway, seen and evaluated   Chief Complaint: chest pain  HPI:   Pt states he started to have sweatiness after he ate pizza and drank mountain dew about 2hrs ago. Reports associated nausea and dizziness.  States it felt like heart was skipping beats states felt slow. Pt reports chest pressure and neck pain for several days, that would come and go. denies chest pressure on exertion. No hx of heart problems. Does not take any medication.   ROS: chest pressure, neck pain, sob, dizziness, nausea, diaphoresis.   Physical Exam:   Gen: No distress  Neuro: Awake and Alert  Skin: Warm    Focused Exam: nad. Lungs clear. Regular hr and rhythm  Pt in NAD. Pt states "I know I am having a heart attack." explained we are obtaining labs, CXR, ecg.     Initiation of care has begun. The patient has been counseled on the process, plan, and necessity for staying for the completion/evaluation, and the remainder of the medical screening examination    Jaynie Crumble, PA-C 08/20/18 1948    Benjiman Core, MD 08/24/18 [phone]

## 2020-07-20 ENCOUNTER — Emergency Department (HOSPITAL_COMMUNITY): Payer: 59

## 2020-07-20 ENCOUNTER — Emergency Department (HOSPITAL_COMMUNITY)
Admission: EM | Admit: 2020-07-20 | Discharge: 2020-07-20 | Disposition: A | Discharge status: Home / Self Care | Payer: 59 | Attending: Emergency Medicine | Admitting: Emergency Medicine

## 2020-07-20 DIAGNOSIS — R109 Unspecified abdominal pain: Secondary | ICD-10-CM | POA: Insufficient documentation

## 2020-07-20 DIAGNOSIS — Z79899 Other long term (current) drug therapy: Secondary | ICD-10-CM | POA: Diagnosis not present

## 2020-07-20 DIAGNOSIS — N281 Cyst of kidney, acquired: Secondary | ICD-10-CM | POA: Diagnosis not present

## 2020-07-20 DIAGNOSIS — F17210 Nicotine dependence, cigarettes, uncomplicated: Secondary | ICD-10-CM | POA: Insufficient documentation

## 2020-07-20 DIAGNOSIS — R911 Solitary pulmonary nodule: Secondary | ICD-10-CM | POA: Diagnosis not present

## 2020-07-20 DIAGNOSIS — R160 Hepatomegaly, not elsewhere classified: Secondary | ICD-10-CM | POA: Diagnosis not present

## 2020-07-20 DIAGNOSIS — K7689 Other specified diseases of liver: Secondary | ICD-10-CM

## 2020-07-20 LAB — URINALYSIS, ROUTINE W REFLEX MICROSCOPIC
Bilirubin Urine: NEGATIVE
Glucose, UA: NEGATIVE mg/dL
Ketones, ur: NEGATIVE mg/dL
Leukocytes,Ua: NEGATIVE
Nitrite: NEGATIVE
Protein, ur: NEGATIVE mg/dL
Specific Gravity, Urine: 1.019 (ref 1.005–1.030)
pH: 6 (ref 5.0–8.0)

## 2020-07-20 LAB — CBC
HCT: 45.9 % (ref 39.0–52.0)
Hemoglobin: 14.4 g/dL (ref 13.0–17.0)
MCH: 25.9 pg — ABNORMAL LOW (ref 26.0–34.0)
MCHC: 31.4 g/dL (ref 30.0–36.0)
MCV: 82.6 fL (ref 80.0–100.0)
Platelets: 215 10*3/uL (ref 150–400)
RBC: 5.56 MIL/uL (ref 4.22–5.81)
RDW: 13.9 % (ref 11.5–15.5)
WBC: 8.3 10*3/uL (ref 4.0–10.5)
nRBC: 0 % (ref 0.0–0.2)

## 2020-07-20 LAB — COMPREHENSIVE METABOLIC PANEL
ALT: 20 U/L (ref 0–44)
AST: 18 U/L (ref 15–41)
Albumin: 4 g/dL (ref 3.5–5.0)
Alkaline Phosphatase: 53 U/L (ref 38–126)
Anion gap: 8 (ref 5–15)
BUN: 16 mg/dL (ref 6–20)
CO2: 28 mmol/L (ref 22–32)
Calcium: 8.9 mg/dL (ref 8.9–10.3)
Chloride: 104 mmol/L (ref 98–111)
Creatinine, Ser: 1.16 mg/dL (ref 0.61–1.24)
GFR calc Af Amer: >60 mL/min (ref >60)
GFR calc non Af Amer: >60 mL/min (ref >60)
Glucose, Bld: 113 mg/dL — ABNORMAL HIGH (ref 70–99)
Potassium: 4.2 mmol/L (ref 3.5–5.1)
Sodium: 140 mmol/L (ref 135–145)
Total Bilirubin: 0.6 mg/dL (ref 0.3–1.2)
Total Protein: 7.2 g/dL (ref 6.5–8.1)

## 2020-07-20 MED ORDER — ONDANSETRON 4 MG PO TBDP
4.0000 mg | ORAL_TABLET | Freq: Once | ORAL | Status: AC
  Administered 2020-07-20: 4 mg via ORAL
  Filled 2020-07-20: qty 1

## 2020-07-20 MED ORDER — HYDROCODONE-ACETAMINOPHEN 5-325 MG PO TABS
1.0000 | ORAL_TABLET | Freq: Once | ORAL | Status: AC
  Administered 2020-07-20: 1 via ORAL
  Filled 2020-07-20: qty 1

## 2020-07-20 MED ORDER — ONDANSETRON 4 MG PO TBDP: 4.0000 mg | ORAL_TABLET | Freq: Three times a day (TID) | ORAL | 0 refills | Status: DC | PRN

## 2020-07-20 MED ORDER — OXYCODONE-ACETAMINOPHEN 5-325 MG PO TABS
1.0000 | ORAL_TABLET | Freq: Once | ORAL | Status: AC
  Administered 2020-07-20: 1 via ORAL
  Filled 2020-07-20: qty 1

## 2020-07-20 MED ORDER — OXYCODONE-ACETAMINOPHEN 5-325 MG PO TABS: 1.0000 | ORAL_TABLET | Freq: Four times a day (QID) | ORAL | 0 refills | Status: DC | PRN

## 2020-07-20 MED ORDER — KETOROLAC TROMETHAMINE 30 MG/ML IJ SOLN
30.0000 mg | Freq: Once | INTRAMUSCULAR | Status: AC
  Administered 2020-07-20: 30 mg via INTRAMUSCULAR
  Filled 2020-07-20: qty 1

## 2020-07-20 MED ORDER — METHOCARBAMOL 500 MG PO TABS: 500.0000 mg | ORAL_TABLET | Freq: Two times a day (BID) | ORAL | 0 refills | Status: DC

## 2020-07-20 NOTE — ED Triage Notes (Signed)
Pt c/o R flank pain x 2 days, hx of kidney stones

## 2020-07-20 NOTE — ED Notes (Signed)
Pt states pain is in right flank and it is radiating toward his right leg. Pain started Sunday and pain is sharp and constant. 9 out of 10.

## 2020-07-20 NOTE — ED Provider Notes (Addendum)
[facility] EMERGENCY DEPARTMENT Provider Note   CSN: [id number] Arrival date & time: 07/20/20  0132     History Chief Complaint  Patient presents with  . Flank Pain    Adam Mcguire is a 54 y.o. male.  HPI Patient is a 54 year old male with past medical history is significant for kidney stones x3 and surgical history is significant for appendectomy.  Patient is presented today with 2 days of right-sided flank pain that is sharp stabbing and intermittent colicky and similar to pain he has had with prior kidney stones.  Patient denies any dysuria frequency urgency.  Denies any nausea vomiting or abdominal pain.  He states he has been eating and drinking normally.  Denies any fevers or chills.  Denies any contact with Covid.  Denies any new strenuous heavy lifting or exercise.  Denies any testicular pain penile pain, swelling or discharge. Denies any lightheadedness dizziness, chest pain or shortness of breath.    Past Medical History:  Diagnosis Date  . Kidney stone 2005    Patient Active Problem List   Diagnosis Date Noted  . Kidney stone 12/06/2011    Past Surgical History:  Procedure Laterality Date  . APPENDECTOMY         No family history on file.  Social History   Tobacco Use  . Smoking status: Current Every Day Smoker    Packs/day: 0.50    Years: 30.00    Pack years: 15.00    Types: Cigarettes  . Smokeless tobacco: Never Used  Substance Use Topics  . Alcohol use: Never  . Drug use: Never    Home Medications Prior to Admission medications   Medication Sig Start Date End Date Taking? Authorizing Provider  cyclobenzaprine (FLEXERIL) 10 MG tablet Take 10 mg by mouth at bedtime. 07/19/20   [provider]  methocarbamol (ROBAXIN) 500 MG tablet Take 1-2 tablets (500-1,000 mg total) by mouth 2 (two) times daily. 07/20/20   [facility], Rodrigo Ran, PA  ondansetron (ZOFRAN ODT) 4 MG disintegrating tablet Take 1 tablet (4 mg total) by mouth  every 8 (eight) hours as needed for nausea or vomiting. 07/20/20   Gailen Shelter, PA  oxyCODONE-acetaminophen (PERCOCET) 5-325 MG tablet Take 1-2 tablets by mouth every 6 (six) hours as needed for severe pain. 07/20/20   Gailen Shelter, PA  propranolol (INNOPRAN XL) 80 MG 24 hr capsule Take 80 mg by mouth at bedtime.     [provider]    Allergies    Vicodin [hydrocodone-acetaminophen]  Review of Systems   Review of Systems  Constitutional: Negative for chills and fever.  HENT: Negative for congestion.   Eyes: Negative for pain.  Respiratory: Negative for cough and shortness of breath.   Cardiovascular: Negative for chest pain and leg swelling.  Gastrointestinal: Negative for abdominal pain and vomiting.  Genitourinary: Positive for flank pain. Negative for dysuria.  Musculoskeletal: Negative for myalgias.  Skin: Negative for rash.  Neurological: Negative for dizziness and headaches.    Physical Exam Updated Vital Signs BP (!) 175/99   Pulse (!) 55   Temp 98 F (36.7 C) (Oral)   Resp 20   SpO2 100%   Physical Exam Vitals and nursing note reviewed.  Constitutional:      General: He is not in acute distress.    Comments: 54 year old male appears stated age and in acute discomfort.  HENT:     Head: Normocephalic and atraumatic.     Nose: Nose normal.  Mouth/Throat:     Mouth: Mucous membranes are moist.  Eyes:     General: No scleral icterus. Cardiovascular:     Rate and Rhythm: Normal rate and regular rhythm.     Pulses: Normal pulses.     Heart sounds: Normal heart sounds.  Pulmonary:     Effort: Pulmonary effort is normal. No respiratory distress.     Breath sounds: No wheezing.  Abdominal:     Palpations: Abdomen is soft.     Tenderness: There is no abdominal tenderness. There is no left CVA tenderness or rebound.     Hernia: No hernia is present.     Comments: Abdomen is protuberant, obese, soft, nondistended, nontender to palpation.  No  guarding or rebound.  No masses.  Questionable right CVA tenderness, no left CVA tenderness.  Musculoskeletal:     Cervical back: Normal range of motion.     Right lower leg: No edema.     Left lower leg: No edema.  Skin:    General: Skin is warm and dry.     Capillary Refill: Capillary refill takes less than 2 seconds.  Neurological:     Mental Status: He is alert. Mental status is at baseline.  Psychiatric:        Mood and Affect: Mood normal.        Behavior: Behavior normal.     ED Results / Procedures / Treatments   Labs (all labs ordered are listed, but only abnormal results are displayed) Labs Reviewed  URINALYSIS, ROUTINE W REFLEX MICROSCOPIC - Abnormal; Notable for the following components:      Result Value   Hgb urine dipstick MODERATE (*)    Bacteria, UA RARE (*)    All other components within normal limits  CBC - Abnormal; Notable for the following components:   MCH 25.9 (*)    All other components within normal limits  COMPREHENSIVE METABOLIC PANEL - Abnormal; Notable for the following components:   Glucose, Bld 113 (*)    All other components within normal limits    EKG None  Radiology CT Renal Stone Study  Result Date: 07/20/2020 CLINICAL DATA:  Right flank pain x2 days. EXAM: CT ABDOMEN AND PELVIS WITHOUT CONTRAST TECHNIQUE: Multidetector CT imaging of the abdomen and pelvis was performed following the standard protocol without IV contrast. COMPARISON:  December 14, 2003 FINDINGS: Lower chest: A 3 mm noncalcified lung nodule is seen within the posterolateral aspect of the right lung base (axial CT image 2, CT series number 7). Hepatobiliary: A 1.2 cm diameter well-defined focus of parenchymal low attenuation is seen within the anteromedial aspect of the liver dome. No gallstones, gallbladder wall thickening, or biliary dilatation. Pancreas: Unremarkable. No pancreatic ductal dilatation or surrounding inflammatory changes. Spleen: Normal in size without focal  abnormality. Adrenals/Urinary Tract: Adrenal glands are unremarkable. Kidneys are normal, without renal calculi or hydronephrosis. A 3.4 cm diameter cyst is seen within the lower pole of the left kidney. Bladder is unremarkable. Stomach/Bowel: Stomach is within normal limits. The appendix is not clearly identified. No evidence of bowel wall thickening, distention, or inflammatory changes. Vascular/Lymphatic: There is mild calcification of the abdominal aorta and bilateral common iliac arteries, without evidence of aneurysmal dilatation. No enlarged abdominal or pelvic lymph nodes. Reproductive: Moderate severity calcification of a mildly enlarged prostate gland is seen. Other: No abdominal wall hernia or abnormality. No abdominopelvic ascites. Musculoskeletal: Mild to moderate severity degenerative changes seen at the level of L5-S1. IMPRESSION: 1. 3 mm noncalcified  lung nodule within the posterolateral aspect of the right lung base. Correlation with chest CT is recommended. 2. 3.4 cm left renal cyst. 3. 1.2 cm diameter well-defined focus of parenchymal low attenuation within the anteromedial aspect of the liver dome. This may represent a small cyst or hemangioma. Correlation with hepatic ultrasound is recommended. 4. Mild to moderate severity degenerative changes at the level of L5-S1. 5. Aortic atherosclerosis. Aortic Atherosclerosis (ICD10-I70.0). Electronically Signed   By: Aram Candelahaddeus  Houston M.D.   On: 07/20/2020 02:35    Procedures Procedures (including critical care time)  Medications Ordered in ED Medications  ondansetron (ZOFRAN-ODT) disintegrating tablet 4 mg (4 mg Oral Given 07/20/20 0206)  oxyCODONE-acetaminophen (PERCOCET/ROXICET) 5-325 MG per tablet 1 tablet (1 tablet Oral Given 07/20/20 0205)  ketorolac (TORADOL) 30 MG/ML injection 30 mg (30 mg Intramuscular Given 07/20/20 0949)  HYDROcodone-acetaminophen (NORCO/VICODIN) 5-325 MG per tablet 1 tablet (1 tablet Oral Given 07/20/20 0948)    ondansetron (ZOFRAN-ODT) disintegrating tablet 4 mg (4 mg Oral Given 07/20/20 [id number])    ED Course  I have reviewed the triage vital signs and the nursing notes.  Pertinent labs & imaging results that were available during my care of the patient were reviewed by me and considered in my medical decision making (see chart for details).    MDM Rules/Calculators/A&P                            CT renal stone study was obtained in waiting room.  Several findings today that are likely not correlated with patient's symptoms.  These include lung nodule which is unchanged from CT scan that was done 2 years ago.  Patient also has left renal cysts and 1.2 cm diameter low-attenuation lesion that may be a hemangioma given patient's history of skin lesions/hemangiomas.   I discussed at length these findings with patient he understands the need for close follow-up with PCP.  He also will need to have his blood pressure checked by PCP and potentially started on BP medication.  He is in significant discomfort currently and I will treat pain and hold off on empiric antihypertensives until he is rechecked by his PCP.  Low suspicion for PE, renal infarct, AAA, thoracic aortic dissection, intra abdominal emergency.  Suspect that this is a recently passed kidney stone or potentially a small kidney stone that is not been revealed on CT imaging.  Additionally this also may be MSK in origin.  Patient with very strict return cautions.  We discussed and share decision-making physician about CT angiography to evaluate for infarct he states he prefer to be discharged with conservative therapy at this time.  I believe this is reasonable.  IMPRESSION: 1. 3 mm noncalcified lung nodule within the posterolateral aspect of the right lung base. Correlation with chest CT is recommended. 2. 3.4 cm left renal cyst. 3. 1.2 cm diameter well-defined focus of parenchymal low attenuation within the anteromedial aspect of the liver dome. This  may represent a small cyst or hemangioma. Correlation with hepatic ultrasound is recommended. 4. Mild to moderate severity degenerative changes at the level of L5-S1. 5. Aortic atherosclerosis. Aortic Atherosclerosis (ICD10-I70.0). Electronically Signed   By: Aram Candelahaddeus  Houston M.D.   On: 07/20/2020 02:35   CMP without abnormality. CBC without leukocytosis or anemia Urinalysis with moderate hemoglobin and rare bacteria.  Without any other abnormal findings.  I discussed this case with my attending physician who cosigned this note including patient's presenting symptoms, physical  exam, and planned diagnostics and interventions. Attending physician stated agreement with plan or made changes to plan which were implemented.   Patient discharged with Zofran, Percocet, muscle relaxer, Tylenol ibuprofen recommendations.  He will follow closely with PCP and urologist at [facility]. He understands strict return precautions and is able to tolerate p.o. and ambulate.  Final Clinical Impression(s) / ED Diagnoses Final diagnoses:  Right flank pain  Pulmonary nodule  Liver nodule  Renal cyst    Rx / DC Orders ED Discharge Orders         Ordered    oxyCODONE-acetaminophen (PERCOCET) 5-325 MG tablet  Every 6 hours PRN,   Status:  Discontinued        07/20/20 0938    methocarbamol (ROBAXIN) 500 MG tablet  2 times daily        07/20/20 0938    ondansetron (ZOFRAN ODT) 4 MG disintegrating tablet  Every 8 hours PRN        07/20/20 0938    oxyCODONE-acetaminophen (PERCOCET) 5-325 MG tablet  Every 6 hours PRN        07/20/20 0940           Gailen Shelter, PA 07/20/20 1320    Solon Augusta Banks, [place] 07/20/20 1320    Benjiman Core, MD 07/20/20 1513

## 2020-07-20 NOTE — Discharge Instructions (Addendum)
Your CT scan did not show any kidney stones today.  It is possible that a kidney stone may have just passed and is still causing spasming pain in the ureter.  Please drink plenty of water and take the pain medicine as prescribed.

## 2020-07-20 NOTE — ED Notes (Signed)
Called Pt multiple times for vitals recheck with no answer

## 2021-01-07 IMAGING — CT CT RENAL STONE PROTOCOL
2 of 4 series · 15 of 46 positions shown, 17 images · non-contrast
Comparison: December 14, 2003

CLINICAL DATA: Right flank pain x2 days.

EXAM:
CT ABDOMEN AND PELVIS WITHOUT CONTRAST
TECHNIQUE: Multidetector CT imaging of the abdomen and pelvis was performed
following the standard protocol without IV contrast.

[Series 3: ap without · axial · non-contrast · 0.64mm/px · z∈[+880,+1260]mm · 12 of 86 slices shown, 14 images]
[im 5/86  soft-tissue]
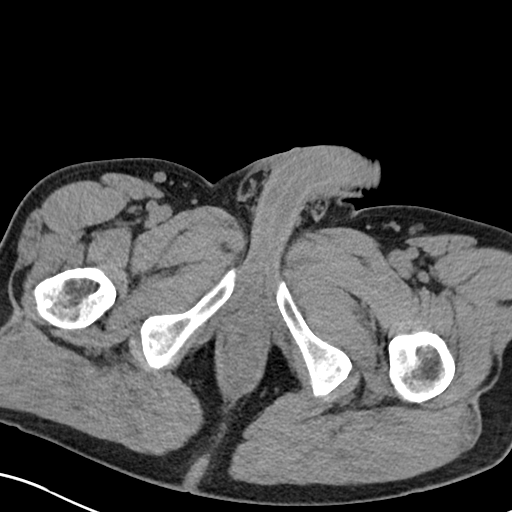
[im 5/86  bone]
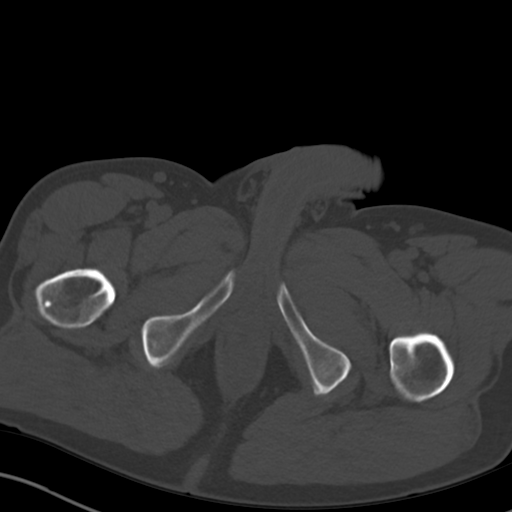
[im 14/86  soft-tissue]
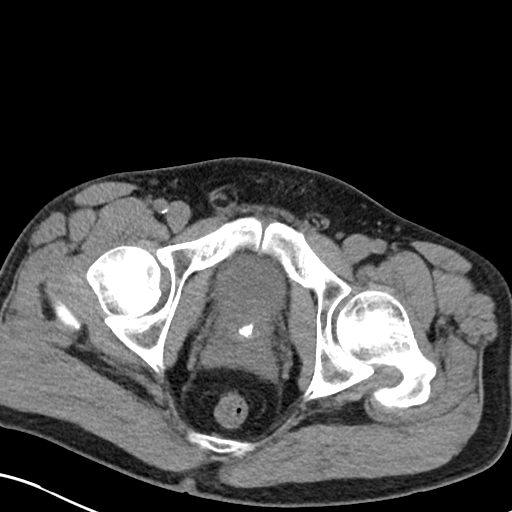
[im 18/86  soft-tissue]
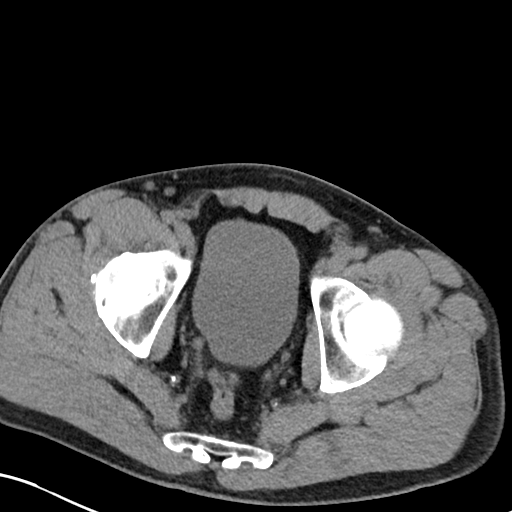
[im 27/86  soft-tissue]
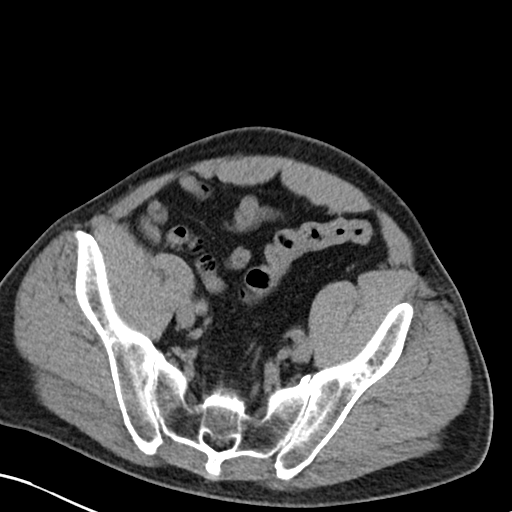
[im 32/86  soft-tissue]
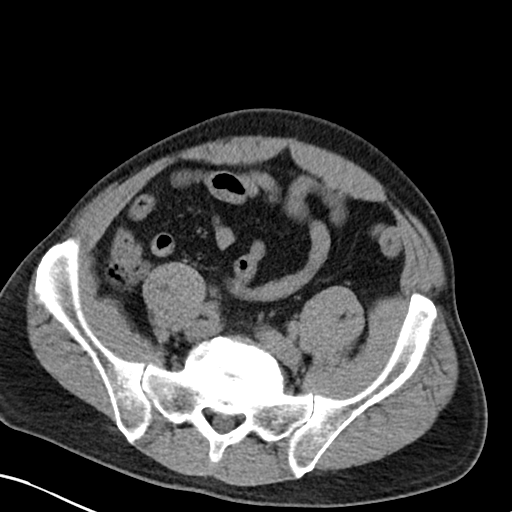
[im 41/86  soft-tissue]
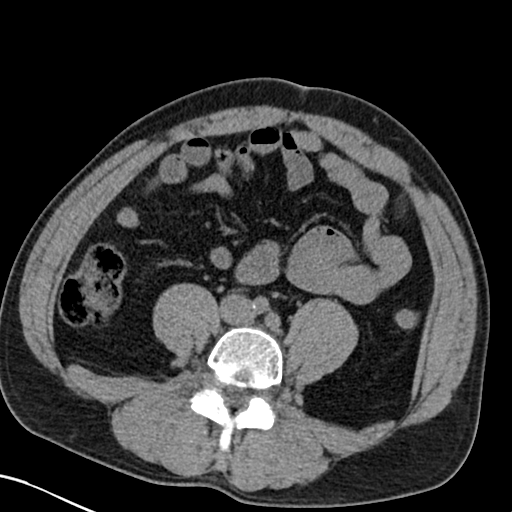
[im 45/86  soft-tissue]
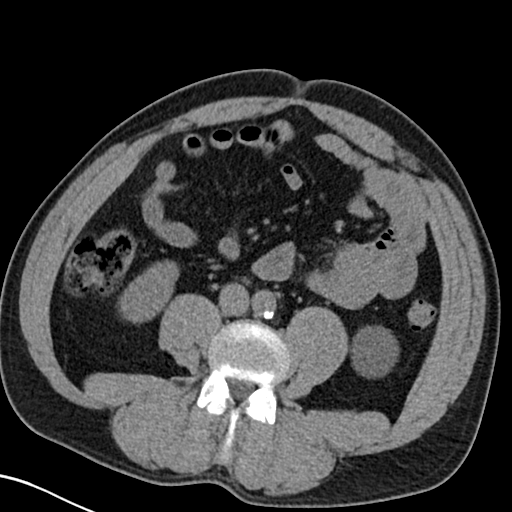
[im 54/86  soft-tissue]
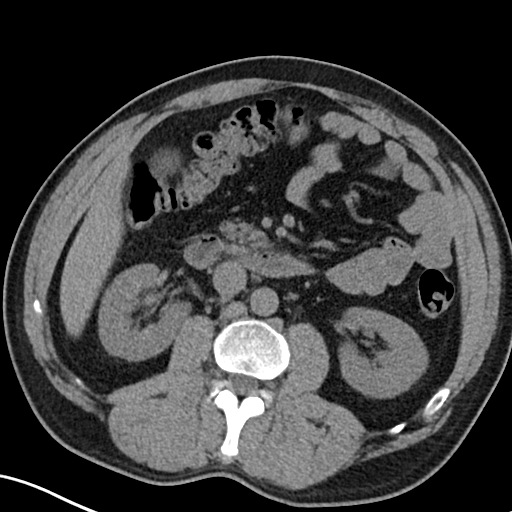
[im 59/86  soft-tissue]
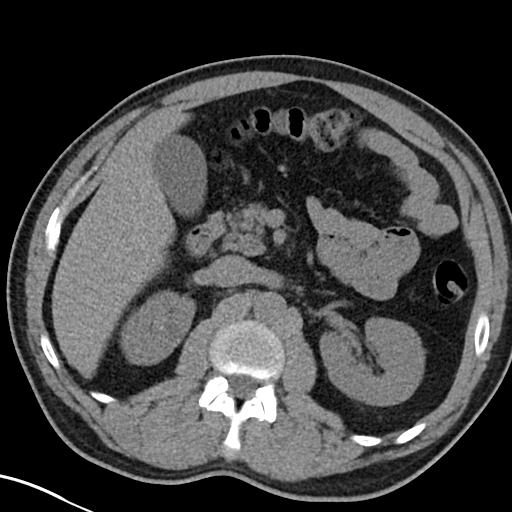
[im 59/86  bone]
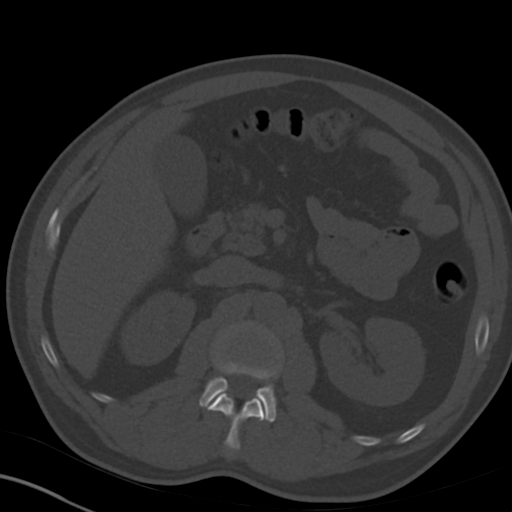
[im 68/86  soft-tissue]
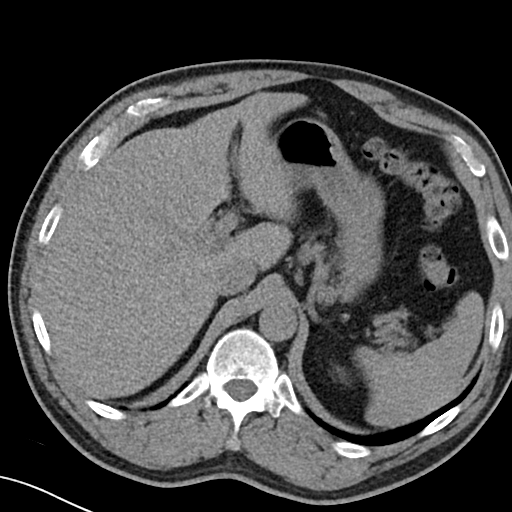
[im 72/86  soft-tissue]
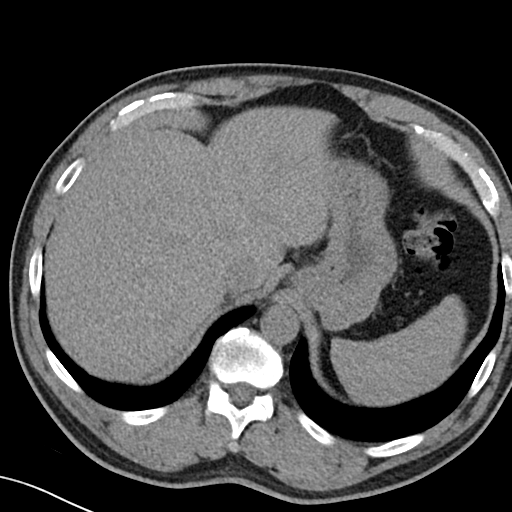
[im 81/86  soft-tissue]
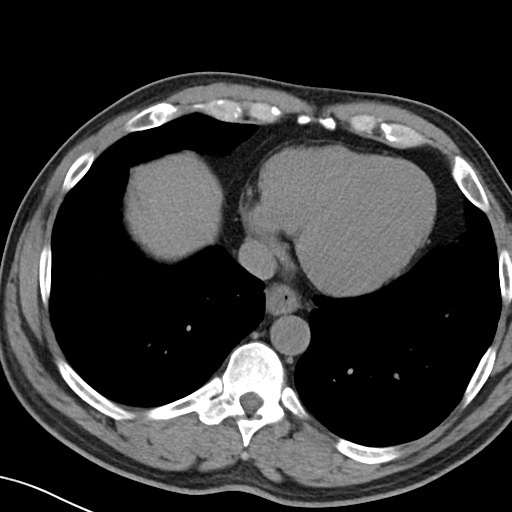

[Series 6: cor · coronal · 0.62mm/px · 3 of 98 slices shown]
[im 33/98  soft-tissue]
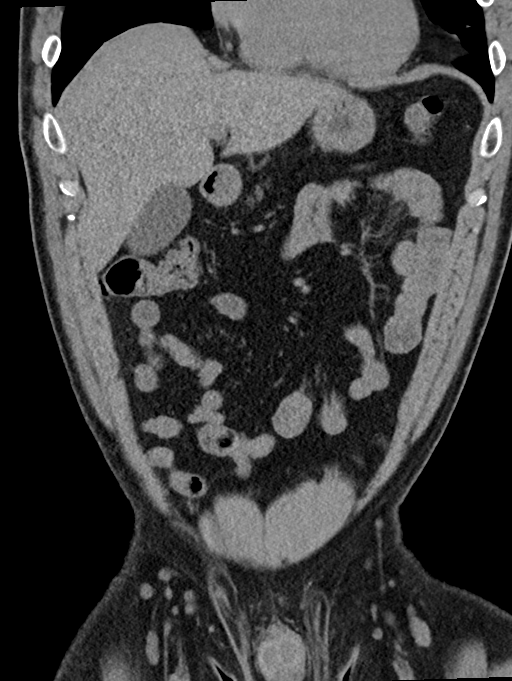
[im 44/98  soft-tissue]
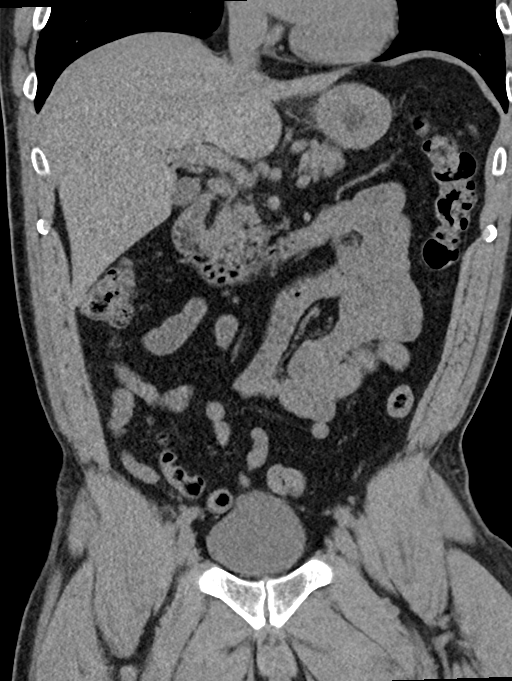
[im 54/98  soft-tissue]
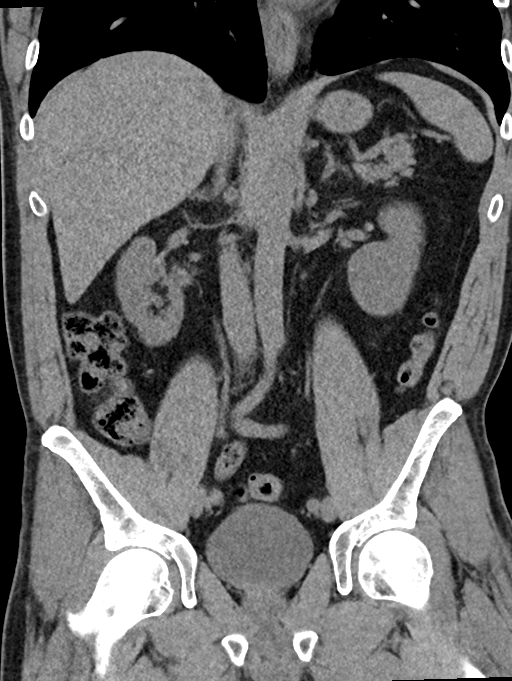

[15 of 46 positions shown; findings below may reference images not displayed]

FINDINGS: Lower chest: A 3 mm noncalcified lung nodule is seen within the
posterolateral aspect of the right lung base (axial CT image 2, CT
series number 7).

Hepatobiliary: A 1.2 cm diameter well-defined focus of parenchymal
low attenuation is seen within the anteromedial aspect of the liver
dome. No gallstones, gallbladder wall thickening, or biliary
dilatation.

Pancreas: Unremarkable. No pancreatic ductal dilatation or
surrounding inflammatory changes.

Spleen: Normal in size without focal abnormality.

Adrenals/Urinary Tract: Adrenal glands are unremarkable. Kidneys are
normal, without renal calculi or hydronephrosis. A 3.4 cm diameter
cyst is seen within the lower pole of the left kidney. Bladder is
unremarkable.

Stomach/Bowel: Stomach is within normal limits. The appendix is not
clearly identified. No evidence of bowel wall thickening,
distention, or inflammatory changes.

Vascular/Lymphatic: There is mild calcification of the abdominal
aorta and bilateral common iliac arteries, without evidence of
aneurysmal dilatation. No enlarged abdominal or pelvic lymph nodes.

Reproductive: Moderate severity calcification of a mildly enlarged
prostate gland is seen.

Other: No abdominal wall hernia or abnormality. No abdominopelvic
ascites.

Musculoskeletal: Mild to moderate severity degenerative changes seen
at the level of L5-S1.
IMPRESSION: 1. 3 mm noncalcified lung nodule within the posterolateral aspect of
the right lung base. Correlation with chest CT is recommended.
2. 3.4 cm left renal cyst.
3. 1.2 cm diameter well-defined focus of parenchymal low attenuation
within the anteromedial aspect of the liver dome. This may represent
a small cyst or hemangioma. Correlation with hepatic ultrasound is
recommended.
4. Mild to moderate severity degenerative changes at the level of
L5-S1.
5. Aortic atherosclerosis.

Aortic Atherosclerosis ([id number]).

## 2023-10-11 ENCOUNTER — Ambulatory Visit: Payer: 59 | Admitting: General Practice

## 2024-01-17 ENCOUNTER — Other Ambulatory Visit: Payer: Self-pay | Admitting: Urology

## 2024-01-23 ENCOUNTER — Other Ambulatory Visit: Payer: Self-pay

## 2024-01-23 ENCOUNTER — Encounter (HOSPITAL_COMMUNITY): Payer: Self-pay | Admitting: Emergency Medicine

## 2024-01-23 ENCOUNTER — Inpatient Hospital Stay (HOSPITAL_COMMUNITY)
Admission: EM | Admit: 2024-01-23 | Discharge: 2024-01-25 | DRG: 690 | Disposition: A | Discharge status: Home / Self Care | Attending: Internal Medicine | Admitting: Internal Medicine

## 2024-01-23 ENCOUNTER — Emergency Department (HOSPITAL_COMMUNITY)

## 2024-01-23 DIAGNOSIS — Z885 Allergy status to narcotic agent status: Secondary | ICD-10-CM

## 2024-01-23 DIAGNOSIS — R109 Unspecified abdominal pain: Secondary | ICD-10-CM | POA: Diagnosis present

## 2024-01-23 DIAGNOSIS — N3091 Cystitis, unspecified with hematuria: Principal | ICD-10-CM

## 2024-01-23 DIAGNOSIS — Z800 Family history of malignant neoplasm of digestive organs: Secondary | ICD-10-CM

## 2024-01-23 DIAGNOSIS — I700 Atherosclerosis of aorta: Secondary | ICD-10-CM | POA: Diagnosis present

## 2024-01-23 DIAGNOSIS — N12 Tubulo-interstitial nephritis, not specified as acute or chronic: Secondary | ICD-10-CM

## 2024-01-23 DIAGNOSIS — Z8249 Family history of ischemic heart disease and other diseases of the circulatory system: Secondary | ICD-10-CM

## 2024-01-23 DIAGNOSIS — R350 Frequency of micturition: Secondary | ICD-10-CM | POA: Diagnosis present

## 2024-01-23 DIAGNOSIS — E782 Mixed hyperlipidemia: Secondary | ICD-10-CM | POA: Diagnosis present

## 2024-01-23 DIAGNOSIS — N1832 Chronic kidney disease, stage 3b: Secondary | ICD-10-CM | POA: Diagnosis present

## 2024-01-23 DIAGNOSIS — N401 Enlarged prostate with lower urinary tract symptoms: Secondary | ICD-10-CM | POA: Diagnosis present

## 2024-01-23 DIAGNOSIS — E66811 Obesity, class 1: Secondary | ICD-10-CM | POA: Diagnosis present

## 2024-01-23 DIAGNOSIS — N390 Urinary tract infection, site not specified: Secondary | ICD-10-CM | POA: Diagnosis present

## 2024-01-23 DIAGNOSIS — N136 Pyonephrosis: Principal | ICD-10-CM | POA: Diagnosis present

## 2024-01-23 DIAGNOSIS — Z87442 Personal history of urinary calculi: Secondary | ICD-10-CM

## 2024-01-23 DIAGNOSIS — R319 Hematuria, unspecified: Secondary | ICD-10-CM | POA: Diagnosis present

## 2024-01-23 DIAGNOSIS — N1330 Unspecified hydronephrosis: Secondary | ICD-10-CM | POA: Diagnosis present

## 2024-01-23 DIAGNOSIS — Z6831 Body mass index (BMI) 31.0-31.9, adult: Secondary | ICD-10-CM

## 2024-01-23 DIAGNOSIS — Z8042 Family history of malignant neoplasm of prostate: Secondary | ICD-10-CM

## 2024-01-23 DIAGNOSIS — R3915 Urgency of urination: Secondary | ICD-10-CM | POA: Diagnosis present

## 2024-01-23 DIAGNOSIS — N179 Acute kidney failure, unspecified: Secondary | ICD-10-CM | POA: Diagnosis present

## 2024-01-23 DIAGNOSIS — I129 Hypertensive chronic kidney disease with stage 1 through stage 4 chronic kidney disease, or unspecified chronic kidney disease: Secondary | ICD-10-CM | POA: Diagnosis present

## 2024-01-23 DIAGNOSIS — R3914 Feeling of incomplete bladder emptying: Secondary | ICD-10-CM | POA: Diagnosis present

## 2024-01-23 DIAGNOSIS — N138 Other obstructive and reflux uropathy: Secondary | ICD-10-CM | POA: Diagnosis present

## 2024-01-23 DIAGNOSIS — Z79899 Other long term (current) drug therapy: Secondary | ICD-10-CM

## 2024-01-23 DIAGNOSIS — F17210 Nicotine dependence, cigarettes, uncomplicated: Secondary | ICD-10-CM | POA: Diagnosis present

## 2024-01-23 LAB — COMPREHENSIVE METABOLIC PANEL
ALT: 23 U/L (ref 0–44)
AST: 27 U/L (ref 15–41)
Albumin: 3.9 g/dL (ref 3.5–5.0)
Alkaline Phosphatase: 64 U/L (ref 38–126)
Anion gap: 10 (ref 5–15)
BUN: 29 mg/dL — ABNORMAL HIGH (ref 6–20)
CO2: 24 mmol/L (ref 22–32)
Calcium: 9.1 mg/dL (ref 8.9–10.3)
Chloride: 101 mmol/L (ref 98–111)
Creatinine, Ser: 2.18 mg/dL — ABNORMAL HIGH (ref 0.61–1.24)
GFR, Estimated: 35 mL/min — ABNORMAL LOW (ref >60)
Glucose, Bld: 128 mg/dL — ABNORMAL HIGH (ref 70–99)
Potassium: 4.1 mmol/L (ref 3.5–5.1)
Sodium: 135 mmol/L (ref 135–145)
Total Bilirubin: 0.7 mg/dL (ref 0.0–1.2)
Total Protein: 8.4 g/dL — ABNORMAL HIGH (ref 6.5–8.1)

## 2024-01-23 LAB — URINALYSIS, ROUTINE W REFLEX MICROSCOPIC

## 2024-01-23 LAB — CBC WITH DIFFERENTIAL/PLATELET
Abs Immature Granulocytes: 0.04 10*3/uL (ref 0.00–0.07)
Basophils Absolute: 0.1 10*3/uL (ref 0.0–0.1)
Basophils Relative: 0 %
Eosinophils Absolute: 0.3 10*3/uL (ref 0.0–0.5)
Eosinophils Relative: 2 %
HCT: 42.6 % (ref 39.0–52.0)
Hemoglobin: 13.8 g/dL (ref 13.0–17.0)
Immature Granulocytes: 0 %
Lymphocytes Relative: 18 %
Lymphs Abs: 2.3 10*3/uL (ref 0.7–4.0)
MCH: 26.4 pg (ref 26.0–34.0)
MCHC: 32.4 g/dL (ref 30.0–36.0)
MCV: 81.5 fL (ref 80.0–100.0)
Monocytes Absolute: 0.8 10*3/uL (ref 0.1–1.0)
Monocytes Relative: 6 %
Neutro Abs: 9 10*3/uL — ABNORMAL HIGH (ref 1.7–7.7)
Neutrophils Relative %: 74 %
Platelets: 340 10*3/uL (ref 150–400)
RBC: 5.23 MIL/uL (ref 4.22–5.81)
RDW: 13 % (ref 11.5–15.5)
WBC: 12.5 10*3/uL — ABNORMAL HIGH (ref 4.0–10.5)
nRBC: 0 % (ref 0.0–0.2)

## 2024-01-23 LAB — URINALYSIS, MICROSCOPIC (REFLEX): RBC / HPF: >50 RBC/hpf (ref 0–5)

## 2024-01-23 NOTE — ED Triage Notes (Signed)
 Patient c/o bilateral flank pain and hematuria since Monday.  Patient has a history of kidney stones.  Patient seen at [facility] urology and given abx, patient states that the pain and hematuria has gotten worse.

## 2024-01-24 ENCOUNTER — Emergency Department (HOSPITAL_COMMUNITY)

## 2024-01-24 ENCOUNTER — Encounter (HOSPITAL_COMMUNITY): Payer: Self-pay | Admitting: Family Medicine

## 2024-01-24 DIAGNOSIS — E782 Mixed hyperlipidemia: Secondary | ICD-10-CM | POA: Diagnosis present

## 2024-01-24 DIAGNOSIS — R109 Unspecified abdominal pain: Secondary | ICD-10-CM | POA: Diagnosis present

## 2024-01-24 DIAGNOSIS — R3914 Feeling of incomplete bladder emptying: Secondary | ICD-10-CM | POA: Diagnosis present

## 2024-01-24 DIAGNOSIS — I700 Atherosclerosis of aorta: Secondary | ICD-10-CM | POA: Diagnosis present

## 2024-01-24 DIAGNOSIS — N138 Other obstructive and reflux uropathy: Secondary | ICD-10-CM | POA: Diagnosis present

## 2024-01-24 DIAGNOSIS — Z8249 Family history of ischemic heart disease and other diseases of the circulatory system: Secondary | ICD-10-CM | POA: Diagnosis not present

## 2024-01-24 DIAGNOSIS — E66811 Obesity, class 1: Secondary | ICD-10-CM | POA: Diagnosis present

## 2024-01-24 DIAGNOSIS — R3915 Urgency of urination: Secondary | ICD-10-CM | POA: Diagnosis present

## 2024-01-24 DIAGNOSIS — N1330 Unspecified hydronephrosis: Secondary | ICD-10-CM | POA: Diagnosis present

## 2024-01-24 DIAGNOSIS — F17210 Nicotine dependence, cigarettes, uncomplicated: Secondary | ICD-10-CM | POA: Diagnosis present

## 2024-01-24 DIAGNOSIS — Z885 Allergy status to narcotic agent status: Secondary | ICD-10-CM | POA: Diagnosis not present

## 2024-01-24 DIAGNOSIS — N390 Urinary tract infection, site not specified: Secondary | ICD-10-CM | POA: Diagnosis present

## 2024-01-24 DIAGNOSIS — E559 Vitamin D deficiency, unspecified: Secondary | ICD-10-CM | POA: Insufficient documentation

## 2024-01-24 DIAGNOSIS — N136 Pyonephrosis: Secondary | ICD-10-CM | POA: Diagnosis present

## 2024-01-24 DIAGNOSIS — N179 Acute kidney failure, unspecified: Secondary | ICD-10-CM | POA: Diagnosis present

## 2024-01-24 DIAGNOSIS — Z8042 Family history of malignant neoplasm of prostate: Secondary | ICD-10-CM | POA: Diagnosis not present

## 2024-01-24 DIAGNOSIS — R350 Frequency of micturition: Secondary | ICD-10-CM | POA: Diagnosis present

## 2024-01-24 DIAGNOSIS — Z6831 Body mass index (BMI) 31.0-31.9, adult: Secondary | ICD-10-CM | POA: Diagnosis not present

## 2024-01-24 DIAGNOSIS — R319 Hematuria, unspecified: Secondary | ICD-10-CM | POA: Diagnosis present

## 2024-01-24 DIAGNOSIS — N401 Enlarged prostate with lower urinary tract symptoms: Secondary | ICD-10-CM | POA: Diagnosis present

## 2024-01-24 DIAGNOSIS — N1832 Chronic kidney disease, stage 3b: Secondary | ICD-10-CM | POA: Diagnosis present

## 2024-01-24 DIAGNOSIS — Z87442 Personal history of urinary calculi: Secondary | ICD-10-CM | POA: Diagnosis not present

## 2024-01-24 DIAGNOSIS — Z800 Family history of malignant neoplasm of digestive organs: Secondary | ICD-10-CM | POA: Diagnosis not present

## 2024-01-24 DIAGNOSIS — Z79899 Other long term (current) drug therapy: Secondary | ICD-10-CM | POA: Diagnosis not present

## 2024-01-24 DIAGNOSIS — N3091 Cystitis, unspecified with hematuria: Secondary | ICD-10-CM | POA: Diagnosis present

## 2024-01-24 DIAGNOSIS — I129 Hypertensive chronic kidney disease with stage 1 through stage 4 chronic kidney disease, or unspecified chronic kidney disease: Secondary | ICD-10-CM | POA: Diagnosis present

## 2024-01-24 LAB — CBC
HCT: 39 % (ref 39.0–52.0)
Hemoglobin: 12.7 g/dL — ABNORMAL LOW (ref 13.0–17.0)
MCH: 26.3 pg (ref 26.0–34.0)
MCHC: 32.6 g/dL (ref 30.0–36.0)
MCV: 80.9 fL (ref 80.0–100.0)
Platelets: 299 10*3/uL (ref 150–400)
RBC: 4.82 MIL/uL (ref 4.22–5.81)
RDW: 12.9 % (ref 11.5–15.5)
WBC: 11.7 10*3/uL — ABNORMAL HIGH (ref 4.0–10.5)
nRBC: 0 % (ref 0.0–0.2)

## 2024-01-24 LAB — BASIC METABOLIC PANEL
Anion gap: 11 (ref 5–15)
BUN: 34 mg/dL — ABNORMAL HIGH (ref 6–20)
CO2: 22 mmol/L (ref 22–32)
Calcium: 8.4 mg/dL — ABNORMAL LOW (ref 8.9–10.3)
Chloride: 100 mmol/L (ref 98–111)
Creatinine, Ser: 2.09 mg/dL — ABNORMAL HIGH (ref 0.61–1.24)
GFR, Estimated: 36 mL/min — ABNORMAL LOW (ref >60)
Glucose, Bld: 116 mg/dL — ABNORMAL HIGH (ref 70–99)
Potassium: 4.2 mmol/L (ref 3.5–5.1)
Sodium: 133 mmol/L — ABNORMAL LOW (ref 135–145)

## 2024-01-24 LAB — LACTIC ACID, PLASMA
Lactic Acid, Venous: 1.1 mmol/L (ref 0.5–1.9)
Lactic Acid, Venous: 1.4 mmol/L (ref 0.5–1.9)

## 2024-01-24 LAB — URINE CULTURE: Culture: NO GROWTH

## 2024-01-24 MED ORDER — NICOTINE 14 MG/24HR TD PT24: 14.0000 mg | MEDICATED_PATCH | Freq: Every day | TRANSDERMAL | Status: DC | PRN

## 2024-01-24 MED ORDER — OXYCODONE HCL 5 MG PO TABS
5.0000 mg | ORAL_TABLET | Freq: Four times a day (QID) | ORAL | Status: DC | PRN
  Administered 2024-01-24 – 2024-01-25 (×4): 5 mg via ORAL
  Filled 2024-01-24 (×4): qty 1

## 2024-01-24 MED ORDER — ONDANSETRON HCL 4 MG/2ML IJ SOLN
4.0000 mg | Freq: Once | INTRAMUSCULAR | Status: AC
  Administered 2024-01-24: 4 mg via INTRAVENOUS
  Filled 2024-01-24: qty 2

## 2024-01-24 MED ORDER — SODIUM CHLORIDE 0.9 % IV SOLN
1.0000 g | Freq: Once | INTRAVENOUS | Status: AC
  Administered 2024-01-24: 1 g via INTRAVENOUS
  Filled 2024-01-24: qty 10

## 2024-01-24 MED ORDER — ONDANSETRON HCL 4 MG/2ML IJ SOLN: 4.0000 mg | Freq: Four times a day (QID) | INTRAMUSCULAR | Status: DC | PRN

## 2024-01-24 MED ORDER — SODIUM CHLORIDE 0.9 % IV SOLN
1.0000 g | INTRAVENOUS | Status: DC
  Administered 2024-01-24: 1 g via INTRAVENOUS
  Filled 2024-01-24: qty 10

## 2024-01-24 MED ORDER — FENTANYL CITRATE PF 50 MCG/ML IJ SOSY
25.0000 ug | PREFILLED_SYRINGE | INTRAMUSCULAR | Status: DC | PRN
  Administered 2024-01-24: 25 ug via INTRAVENOUS
  Filled 2024-01-24: qty 1

## 2024-01-24 MED ORDER — SODIUM CHLORIDE 0.9 % IV SOLN: INTRAVENOUS | Status: AC

## 2024-01-24 MED ORDER — LABETALOL HCL 5 MG/ML IV SOLN
20.0000 mg | INTRAVENOUS | Status: DC | PRN
  Administered 2024-01-24: 20 mg via INTRAVENOUS
  Filled 2024-01-24: qty 4

## 2024-01-24 MED ORDER — LACTATED RINGERS IV BOLUS
1000.0000 mL | Freq: Once | INTRAVENOUS | Status: AC
  Administered 2024-01-24: 1000 mL via INTRAVENOUS

## 2024-01-24 MED ORDER — FENTANYL CITRATE PF 50 MCG/ML IJ SOSY
50.0000 ug | PREFILLED_SYRINGE | Freq: Once | INTRAMUSCULAR | Status: AC
  Administered 2024-01-24: 50 ug via INTRAVENOUS
  Filled 2024-01-24: qty 1

## 2024-01-24 MED ORDER — SODIUM CHLORIDE 0.9 % IV SOLN: INTRAVENOUS | Status: DC

## 2024-01-24 MED ORDER — ACETAMINOPHEN 325 MG PO TABS
650.0000 mg | ORAL_TABLET | Freq: Four times a day (QID) | ORAL | Status: DC | PRN
Start: 2024-01-24 — End: 2024-01-25
  Administered 2024-01-24 – 2024-01-25 (×3): 650 mg via ORAL
  Filled 2024-01-24 (×3): qty 2

## 2024-01-24 NOTE — ED Provider Notes (Signed)
 Adam Mcgu[facility] EMERGENCY DEPARTMENT AT [facility] Provider Note   CSN: [id number] Arrival date & time: 01/23/24  2119     History  Chief Complaint  Patient presents with   Flank Pain   Hematuria    Adam Mcguire is a 58 y.o. male.  Patient with a history of kidney stones here with bilateral flank pain, abdominal pain and hematuria for the past 3 days.  States he was urinating nothing but blood.  He is going every 10 to 15 minutes.  He was seen at the urology office 2 days ago and told he did not have any kidney stones.  He was started on levofloxacin with which she has been taking.  Returns tonight with worsening flank pain, abdominal pain, urgency, frequency, hematuria.  Does have a history of kidney stones.  Denies fever.  Has had intermittent nausea and vomiting at home as well.  Complains of diffuse abdominal pain that radiates to his testicles and bilateral flanks.  No chest pain or shortness of breath.  No fever.  Does have pain with urination, frequency, urgency and hematuria.  No blood thinner use.  He is scheduled to have a TURP next week.  The history is provided by the patient.  Flank Pain Associated symptoms include abdominal pain. Pertinent negatives include no chest pain, no headaches and no shortness of breath.  Hematuria Associated symptoms include abdominal pain. Pertinent negatives include no chest pain, no headaches and no shortness of breath.       Home Medications Prior to Admission medications   Medication Sig Start Date End Date Taking? Authorizing Provider  cyclobenzaprine (FLEXERIL) 10 MG tablet Take 10 mg by mouth at bedtime. 07/19/20   [provider]  methocarbamol (ROBAXIN) 500 MG tablet Take 1-2 tablets (500-1,000 mg total) by mouth 2 (two) times daily. 07/20/20   Fondaw, Rodrigo Ran, PA  ondansetron (ZOFRAN ODT) 4 MG disintegrating tablet Take 1 tablet (4 mg total) by mouth every 8 (eight) hours as needed for nausea or vomiting. 07/20/20    Gailen Shelter, PA  oxyCODONE-acetaminophen (PERCOCET) 5-325 MG tablet Take 1-2 tablets by mouth every 6 (six) hours as needed for severe pain. 07/20/20   Gailen Shelter, PA  propranolol (INNOPRAN XL) 80 MG 24 hr capsule Take 80 mg by mouth at bedtime.     [provider]      Allergies    Vicodin [hydrocodone-acetaminophen]    Review of Systems   Review of Systems  Constitutional:  Negative for activity change, appetite change and fever.  HENT:  Negative for congestion and rhinorrhea.   Respiratory:  Negative for cough, chest tightness and shortness of breath.   Cardiovascular:  Negative for chest pain.  Gastrointestinal:  Positive for abdominal pain, nausea and vomiting.  Genitourinary:  Positive for difficulty urinating, dysuria, flank pain, hematuria and urgency.  Musculoskeletal:  Positive for arthralgias, back pain and myalgias.  Skin:  Negative for rash.  Neurological:  Negative for dizziness, weakness and headaches.   all other systems are negative except as noted in the HPI and PMH.    Physical Exam Updated Vital Signs BP (!) 174/102 (BP Location: Left Arm)   Pulse 85   Temp 97.8 F (36.6 C) (Oral)   Resp 20   Wt 84 kg   SpO2 99%   BMI 29.44 kg/m  Physical Exam Vitals and nursing note reviewed.  Constitutional:      General: He is in acute distress.  Appearance: He is well-developed.     Comments: Uncomfortable  HENT:     Head: Normocephalic and atraumatic.     Mouth/Throat:     Pharynx: No oropharyngeal exudate.  Eyes:     Conjunctiva/sclera: Conjunctivae normal.     Pupils: Pupils are equal, round, and reactive to light.  Neck:     Comments: No meningismus. Cardiovascular:     Rate and Rhythm: Normal rate and regular rhythm.     Heart sounds: Normal heart sounds. No murmur heard. Pulmonary:     Effort: Pulmonary effort is normal. No respiratory distress.     Breath sounds: Normal breath sounds.  Abdominal:     Palpations: Abdomen is  soft.     Tenderness: There is abdominal tenderness. There is no guarding or rebound.     Comments: Diffuse tenderness, no guarding or rebound  Genitourinary:    Comments: Testicles normal to inspection but painful bilaterally, no asymmetry Musculoskeletal:        General: Tenderness present. Normal range of motion.     Cervical back: Normal range of motion and neck supple.     Comments: Paraspinal lumbar tenderness bilaterally  Skin:    General: Skin is warm.  Neurological:     Mental Status: He is alert and oriented to person, place, and time.     Cranial Nerves: No cranial nerve deficit.     Motor: No abnormal muscle tone.     Coordination: Coordination normal.     Comments:  5/5 strength throughout. CN 2-12 intact.Equal grip strength.   Psychiatric:        Behavior: Behavior normal.     ED Results / Procedures / Treatments   Labs (all labs ordered are listed, but only abnormal results are displayed) Labs Reviewed  URINALYSIS, ROUTINE W REFLEX MICROSCOPIC - Abnormal; Notable for the following components:      Result Value   Color, Urine RED (*)    APPearance TURBID (*)    Glucose, UA   (*)    Value: TEST NOT REPORTED DUE TO COLOR INTERFERENCE OF URINE PIGMENT   Hgb urine dipstick   (*)    Value: TEST NOT REPORTED DUE TO COLOR INTERFERENCE OF URINE PIGMENT   Bilirubin Urine   (*)    Value: TEST NOT REPORTED DUE TO COLOR INTERFERENCE OF URINE PIGMENT   Ketones, ur   (*)    Value: TEST NOT REPORTED DUE TO COLOR INTERFERENCE OF URINE PIGMENT   Protein, ur   (*)    Value: TEST NOT REPORTED DUE TO COLOR INTERFERENCE OF URINE PIGMENT   Nitrite   (*)    Value: TEST NOT REPORTED DUE TO COLOR INTERFERENCE OF URINE PIGMENT   Leukocytes,Ua   (*)    Value: TEST NOT REPORTED DUE TO COLOR INTERFERENCE OF URINE PIGMENT   All other components within normal limits  CBC WITH DIFFERENTIAL/PLATELET - Abnormal; Notable for the following components:   WBC 12.5 (*)    Neutro Abs 9.0 (*)     All other components within normal limits  COMPREHENSIVE METABOLIC PANEL - Abnormal; Notable for the following components:   Glucose, Bld 128 (*)    BUN 29 (*)    Creatinine, Ser 2.18 (*)    Total Protein 8.4 (*)    GFR, Estimated 35 (*)    All other components within normal limits  URINALYSIS, MICROSCOPIC (REFLEX) - Abnormal; Notable for the following components:   Bacteria, UA RARE (*)    Non  Squamous Epithelial PRESENT (*)    All other components within normal limits  CULTURE, BLOOD (ROUTINE X 2)  CULTURE, BLOOD (ROUTINE X 2)  URINE CULTURE  LACTIC ACID, PLASMA  LACTIC ACID, PLASMA    EKG None  Radiology US SCROTUM W/DOPPLER Result Date: 01/24/2024 CLINICAL DATA:  Testicular pain. EXAM: SCROTAL ULTRASOUND DOPPLER ULTRASOUND OF THE TESTICLES TECHNIQUE: Complete ultrasound examination of the testicles, epididymis, and other scrotal structures was performed. Color and spectral Doppler ultrasound were also utilized to evaluate blood flow to the testicles. COMPARISON:  None Available. FINDINGS: Right testicle Measurements: 3.5 cm x 2.0 cm x 2.7 cm. No mass or microlithiasis visualized. Left testicle Measurements: 3.1 cm x 1.9 cm x 2.9 cm. No mass or microlithiasis visualized. Right epididymis:  Normal in size and appearance. Left epididymis:  Normal in size and appearance. Hydrocele:  There is a small left-sided hydrocele. Varicocele:  Bilateral varicoceles are noted. Pulsed Doppler interrogation of both testes demonstrates normal low resistance arterial and venous waveforms bilaterally. IMPRESSION: 1. Small left-sided hydrocele. 2. Bilateral varicoceles. Electronically Signed   By: Aram Candela M.D.   On: 01/24/2024 02:17   CT Renal Stone Study Result Date: 01/23/2024 CLINICAL DATA:  Bilateral flank pain, hematuria since Monday, history of urinary tract calculi EXAM: CT ABDOMEN AND PELVIS WITHOUT CONTRAST TECHNIQUE: Multidetector CT imaging of the abdomen and pelvis was performed  following the standard protocol without IV contrast. RADIATION DOSE REDUCTION: This exam was performed according to the departmental dose-optimization program which includes automated exposure control, adjustment of the mA and/or kV according to patient size and/or use of iterative reconstruction technique. COMPARISON:  10/17/2023 FINDINGS: Lower chest: No acute pleural or parenchymal lung disease. Hepatobiliary: Stable hepatic cyst at the dome of the liver. Otherwise unremarkable unenhanced appearance of the liver and gallbladder. No biliary duct dilation. Pancreas: Unremarkable unenhanced appearance. Spleen: Unremarkable unenhanced appearance. Adrenals/Urinary Tract: The bladder is decompressed, limiting its evaluation. There is extensive bladder wall thickening and marked perivesicular fat stranding, consistent with cystitis. There is severe bilateral hydronephrosis and hydroureter, right greater than left, not appreciably changed since prior study. Stable renal cysts do not require specific imaging follow-up. No urinary tract calculi. The adrenals are unremarkable. Stomach/Bowel: No bowel obstruction or ileus. The appendix is surgically absent. No bowel wall thickening or inflammatory change. Vascular/Lymphatic: Aortic atherosclerosis. Numerous subcentimeter retroperitoneal lymph nodes are likely reactive. No pathologic adenopathy. Reproductive: Stable enlargement of the prostate, measuring 4.4 x 5.0 by 5.5 cm. Central prostate calcifications are again noted. Other: No free fluid or free intraperitoneal gas. No abdominal wall hernia. Musculoskeletal: No acute or destructive bony abnormalities. Reconstructed images demonstrate no additional findings. IMPRESSION: 1. Decompression urinary bladder, with marked bladder wall thickening and severe perivesicular fat stranding concerning for cystitis. This may be superimposed upon chronic underlying bladder outlet obstruction given the enlarged prostate. Correlation with  urinalysis recommended. 2. Severe bilateral hydronephrosis and hydroureter, right greater than left, unchanged since prior exam. This is of uncertain etiology. 3.  Aortic Atherosclerosis (ICD10-I70.0). Electronically Signed   By: Sharlet Salina M.D.   On: 01/23/2024 22:33    Procedures Procedures    Medications Ordered in ED Medications  fentaNYL (SUBLIMAZE) injection 50 mcg (has no administration in time range)  ondansetron (ZOFRAN) injection 4 mg (has no administration in time range)  lactated ringers bolus 1,000 mL (has no administration in time range)  cefTRIAXone (ROCEPHIN) 1 g in sodium chloride 0.9 % 100 mL IVPB (has no administration in time range)  ED Course/ Medical Decision Making/ A&P                                 Medical Decision Making Amount and/or Complexity of Data Reviewed Independent Historian: spouse Labs: ordered. Decision-making details documented in ED Course. Radiology: ordered and independent interpretation performed. Decision-making details documented in ED Course. ECG/medicine tests: ordered and independent interpretation performed. Decision-making details documented in ED Course.  Risk Prescription drug management. Decision regarding hospitalization.   3 days of abdominal pain, flank pain, hematuria, nausea and vomiting.  Stable vitals on arrival but he is uncomfortable and hypertensive.  No fever.  Labs show gross hematuria concerning for hemorrhagic cystitis.  Do show AKI and leukocytosis.  Will initiate IV fluids and IV antibiotics.  Will send urine culture.  CT scan in triage is negative for obstructive uropathy but does show severe hydronephrosis bilaterally.  Thickening of the bladder with stranding.  No kidney stones ureteral stone seen  Ultrasound negative for torsion.  Does show varicoceles and hydroceles  CT scan with acute cystitis with stranding on CT scan and severe hydronephrosis which is chronic.  Does have AKI and leukocytosis.   Will initiate IV fluids and IV antibiotics blood cultures pending.  Does not appear to be toxic or septic.  Admission discussed with Dr. Antionette Char.  No emergent need for urology intervention.        Final Clinical Impression(s) / ED Diagnoses Final diagnoses:  None    Rx / DC Orders ED Discharge Orders     None         Kelby Lotspeich, Jeannett Senior, MD 01/24/24 0400

## 2024-01-24 NOTE — H&P (Signed)
 History and Physical    Patient: Adam Mcguire:[id number] DOB: 12/22/1965 DOA: 01/23/2024 DOS: the patient was seen and examined on 01/24/2024 PCP: Patient, No Pcp Per  Patient coming from: Home  Chief Complaint:  Chief Complaint  Patient presents with   Flank Pain   Hematuria   HPI: Adam Mcguire is a 58 y.o. male with medical history significant of nephrolithiasis who presented to the emergency department complaints of bilateral flank pain associated with urgency, frequency, mild dysuria and hematuria since Monday.  He was seen at [facility] urology and was given a prescription for oral levofloxacin.  However, he states that his symptoms have become a lot worse since then.  He is on schedule to get TURP next week.  No emesis, diarrhea, constipation, melena or hematochezia.  He denied fever, chills, rhinorrhea, sore throat, wheezing or hemoptysis.  No chest pain, palpitations, diaphoresis, PND, orthopnea or pitting edema of the lower extremities.  No polyuria, polydipsia, polyphagia or blurred vision.   Lab work: Urine analysis was red and turbid due to hematuria showing rare bacteria, greater than 50 RBC and 11-20 WBC.  CBC showed a white count of 12.5, hemoglobin 13.8 g/dL platelets 956.  CMP showed normal electrolytes, glucose of 128, BUN 29 and creatinine 2.18 mg/dL.  Total protein is 8.4 g/dL, the rest of the LFTs were normal.  Lactic acid 1.1 mmol/L.  Imaging: CT renal study showing decompression urinary bladder, with moderate bladder wall thickening and severe perivesicular fat stranding concerning for cystitis.  This may be superimposed upon chronic underlying bladder outlet obstruction given the enlarged prostate.  Correlation with urinalysis recommended.  Severe bilateral hydronephrosis and hydroureter, right greater than left, unchanged since prior exam.  This is of uncertain etiology.  Aortic atherosclerosis.   ED course: Initial vital signs were temperature 97.5 F, pulse 91,  respiration 18, BP 176/111 mmHg and O2 sat 97% on room air.  The patient received ondansetron 4 mg IVP, fentanyl 50 mcg IVP, ceftriaxone 1 g IVPB and 1000 mL liter bolus.  Review of Systems: As mentioned in the history of present illness. All other systems reviewed and are negative. Past Medical History:  Diagnosis Date   Kidney stone 2005   Past Surgical History:  Procedure Laterality Date   APPENDECTOMY     Social History:  reports that he has been smoking cigarettes. He has a 15 pack-year smoking history. He has never used smokeless tobacco. He reports that he does not drink alcohol and does not use drugs.  Allergies  Allergen Reactions   Vicodin [Hydrocodone-Acetaminophen] Nausea And Vomiting    Patient can tolerate acetaminiphen    History reviewed. No pertinent family history.  Prior to Admission medications   Medication Sig Start Date End Date Taking? Authorizing Provider  levofloxacin (LEVAQUIN) 750 MG tablet Take 750 mg by mouth daily. 01/22/24  Yes [provider]  metroNIDAZOLE (FLAGYL) 500 MG tablet Take 2,000 mg by mouth once. 11/08/23  Yes [provider]  cephALEXin (KEFLEX) 500 MG capsule Take 500 mg by mouth 2 (two) times daily. Patient not taking: Reported on 01/24/2024 12/18/23   [provider]  cyclobenzaprine (FLEXERIL) 10 MG tablet Take 10 mg by mouth at bedtime. 07/19/20   [provider]  methocarbamol (ROBAXIN) 500 MG tablet Take 1-2 tablets (500-1,000 mg total) by mouth 2 (two) times daily. Patient not taking: Reported on 01/24/2024 07/20/20   Gailen Shelter, PA  ondansetron (ZOFRAN ODT) 4 MG disintegrating tablet Take 1 tablet (  4 mg total) by mouth every 8 (eight) hours as needed for nausea or vomiting. Patient not taking: Reported on 01/24/2024 07/20/20   Gailen Shelter, PA  ORGOVYX 120 MG tablet Take 120 mg by mouth daily. 01/18/24   [provider]  oxyCODONE-acetaminophen (PERCOCET) 5-325 MG tablet Take 1-2 tablets  by mouth every 6 (six) hours as needed for severe pain. Patient not taking: Reported on 01/24/2024 07/20/20   Gailen Shelter, PA  propranolol (INNOPRAN XL) 80 MG 24 hr capsule Take 80 mg by mouth at bedtime.  Patient not taking: Reported on 01/24/2024    [provider]  tamsulosin (FLOMAX) 0.4 MG CAPS capsule Take 0.4 mg by mouth daily. Patient not taking: Reported on 01/24/2024    [provider]  XTANDI 80 MG tablet Take 80 mg by mouth daily. Patient not taking: Reported on 01/24/2024 12/21/23   [provider]    Physical Exam: Vitals:   01/24/24 0442 01/24/24 0508 01/24/24 0815 01/24/24 0903  BP:  (!) 184/94 (!) 183/103   Pulse:  100 89   Resp:  16 18   Temp: 97.9 F (36.6 C) 98 F (36.7 C)  98.2 F (36.8 C)  TempSrc:      SpO2:  97% 96%   Weight:       Physical Exam Vitals reviewed.  Constitutional:      General: He is awake. He is not in acute distress.    Appearance: He is ill-appearing.  HENT:     Head: Normocephalic.     Nose: No rhinorrhea.     Mouth/Throat:     Mouth: Mucous membranes are dry.  Eyes:     General: No scleral icterus.    Pupils: Pupils are equal, round, and reactive to light.  Neck:     Vascular: No JVD.  Cardiovascular:     Rate and Rhythm: Normal rate and regular rhythm.     Heart sounds: S1 normal and S2 normal.  Pulmonary:     Effort: Pulmonary effort is normal.     Breath sounds: Normal breath sounds. No wheezing, rhonchi or rales.  Abdominal:     General: Bowel sounds are normal. There is no distension.     Palpations: Abdomen is soft.     Tenderness: There is abdominal tenderness in the suprapubic area. There is right CVA tenderness and left CVA tenderness. There is no guarding or rebound.  Musculoskeletal:     Cervical back: Neck supple.     Right lower leg: No edema.     Left lower leg: No edema.  Skin:    General: Skin is warm and dry.  Neurological:     General: No focal deficit present.     Mental  Status: He is alert and oriented to person, place, and time.  Psychiatric:        Mood and Affect: Mood normal.        Behavior: Behavior normal. Behavior is cooperative.     Data Reviewed:  Results are pending, will review when available.  EKG: Vent. rate 97 BPM PR interval 122 ms QRS duration 78 ms QT/QTcB 304/387 ms P-R-T axes 71 70 28 Sinus rhythm Abnormal R-wave progression, early transition  Assessment and Plan: Principal Problem:   Hematuria And:   Flank pain with history of urolithiasis In the setting of:   Acute UTI Associated with:   Bilateral hydronephrosis Inpatient/MedSurg. Continue IV fluids. Analgesics as needed. Antiemetics as needed. Pantoprazole 40 mg IVP daily.  Follow CBC, CMP and lipase in AM. Urology consult appreciated. -TURP already planned for Wednesday next week.  Active Problems:   AKI (acute kidney injury) (HCC) In the setting of above. Continue IV fluids. Avoid hypotension. Avoid nephrotoxins. Monitor intake and output. Monitor renal function electrolytes.    Mixed hyperlipidemia   Aortic atherosclerosis (HCC) Briefly discussed CV risks Will defer to primary care provider.    Class 1 obesity Current BMI 31.32 kg/m. Would benefit from lifestyle modifications. Follow-up closely with PCP and/or bariatric clinic.     Advance Care Planning:   Code Status: Full Code   Consults:   Family Communication:   Severity of Illness: The appropriate patient status for this patient is INPATIENT. Inpatient status is judged to be reasonable and necessary in order to provide the required intensity of service to ensure the patient's safety. The patient's presenting symptoms, physical exam findings, and initial radiographic and laboratory data in the context of their chronic comorbidities is felt to place them at high risk for further clinical deterioration. Furthermore, it is not anticipated that the patient will be medically stable for  discharge from the hospital within 2 midnights of admission.   * I certify that at the point of admission it is my clinical judgment that the patient will require inpatient hospital care spanning beyond 2 midnights from the point of admission due to high intensity of service, high risk for further deterioration and high frequency of surveillance required.*  Author: Bobette Mo, MD 01/24/2024 9:37 AM  For on call review [email or web address].   This document was prepared using Dragon voice recognition software and may contain some unintended transcription errors.

## 2024-01-24 NOTE — Consult Note (Addendum)
 Urology Consult Note   Requesting Attending Physician:  Bobette Mo, MD Service Providing Consult: Urology  Consulting Attending: Dr. Robb Matar   Reason for Consult:  hematuria  HPI: Adam Mcguire is seen in consultation for reasons noted above at the request of Bobette Mo, MD. patient is a 58 year old male presenting to [facility] emergency department for flank pain, ABD pain, and hematuria x 3d.  He is known to our practice and was most recently seen 2 days ago on 01/22/2024.  He was experiencing hematuria at that time and had urinalysis consistent with infection.  He was treated with Levaquin for hemorrhagic cystitis and discharged home.  He has suspected to have metastatic prostate cancer and is receiving Orgovyx.  He carries a 40-pack-year smoking history and has CKD stage IIIb.  On my arrival pt was resting comfortably in street clothes. He reports that he presented to hospital for ongoing hematuria and high B/P. He is not experiencing clot obstruction and does not want foley.  He continues to take Orgovyx is scheduled for TURP with Dr. Berneice Heinrich on 3/26.  ------------------  Assessment:  58 y.o. male with hemorrhagic cystitis, metastatic prostate cancer, CKD IIIB, and hypertension   Recommendations: #hematuria Patient reports that his hematuria has been improving consistently.  We discussed that hemorrhagic cystitis would probably take longer than 2 days to clear completely.  He is amenable to collecting string of bottles overnight and reassessing in the morning.  His hemoglobin is well within range and there is no indication for emergent surgical intervention at this time.  TURP on 3/26  Case and plan discussed with Dr. Cardell Peach  Past Medical History: Past Medical History:  Diagnosis Date   Kidney stone 2005    Past Surgical History:  Past Surgical History:  Procedure Laterality Date   APPENDECTOMY      Medication: Current Facility-Administered Medications   Medication Dose Route Frequency Provider Last Rate Last Admin   acetaminophen (TYLENOL) tablet 650 mg  650 mg Oral Q6H PRN Opyd, Lavone Neri, MD   650 mg at 01/24/24 0926   fentaNYL (SUBLIMAZE) injection 25-50 mcg  25-50 mcg Intravenous Q2H PRN Briscoe Deutscher, MD   25 mcg at 01/24/24 0926   labetalol (NORMODYNE) injection 20 mg  20 mg Intravenous Q2H PRN Bobette Mo, MD   20 mg at 01/24/24 0926   ondansetron (ZOFRAN) injection 4 mg  4 mg Intravenous Q6H PRN Opyd, Lavone Neri, MD       oxyCODONE (Oxy IR/ROXICODONE) immediate release tablet 5 mg  5 mg Oral Q6H PRN Opyd, Lavone Neri, MD   5 mg at 01/24/24 1254   Current Outpatient Medications  Medication Sig Dispense Refill   levofloxacin (LEVAQUIN) 750 MG tablet Take 750 mg by mouth daily.     metroNIDAZOLE (FLAGYL) 500 MG tablet Take 2,000 mg by mouth once.     cephALEXin (KEFLEX) 500 MG capsule Take 500 mg by mouth 2 (two) times daily. (Patient not taking: Reported on 01/24/2024)     cyclobenzaprine (FLEXERIL) 10 MG tablet Take 10 mg by mouth at bedtime.     methocarbamol (ROBAXIN) 500 MG tablet Take 1-2 tablets (500-1,000 mg total) by mouth 2 (two) times daily. (Patient not taking: Reported on 01/24/2024) 20 tablet 0   ondansetron (ZOFRAN ODT) 4 MG disintegrating tablet Take 1 tablet (4 mg total) by mouth every 8 (eight) hours as needed for nausea or vomiting. (Patient not taking: Reported on 01/24/2024) 20 tablet 0  ORGOVYX 120 MG tablet Take 120 mg by mouth daily.     oxyCODONE-acetaminophen (PERCOCET) 5-325 MG tablet Take 1-2 tablets by mouth every 6 (six) hours as needed for severe pain. (Patient not taking: Reported on 01/24/2024) 15 tablet 0   propranolol (INNOPRAN XL) 80 MG 24 hr capsule Take 80 mg by mouth at bedtime.  (Patient not taking: Reported on 01/24/2024)     tamsulosin (FLOMAX) 0.4 MG CAPS capsule Take 0.4 mg by mouth daily. (Patient not taking: Reported on 01/24/2024)     XTANDI 80 MG tablet Take 80 mg by mouth daily. (Patient  not taking: Reported on 01/24/2024)      Allergies: Allergies  Allergen Reactions   Vicodin [Hydrocodone-Acetaminophen] Nausea And Vomiting    Patient can tolerate acetaminiphen    Social History: Social History   Tobacco Use   Smoking status: Every Day    Current packs/day: 0.50    Average packs/day: 0.5 packs/day for 30.0 years (15.0 ttl pk-yrs)    Types: Cigarettes   Smokeless tobacco: Never  Substance Use Topics   Alcohol use: Never   Drug use: Never    Family History Family History  Problem Relation Age of Onset   CAD Father    Colon cancer Brother     Review of Systems  Genitourinary:  Positive for hematuria.     Objective   Vital signs in last 24 hours: BP 131/82   Pulse (!) 59   Temp 98.2 F (36.8 C)   Resp 18   Wt 84 kg   SpO2 92%   BMI 29.44 kg/m   Physical Exam: General: A&O, resting, appropriate HEENT: [place]/AT Pulmonary: Normal work of breathing Cardiovascular: no cyanosis GU: hematuria  Most Recent Labs: Lab Results  Component Value Date   WBC 11.7 (H) 01/24/2024   HGB 12.7 (L) 01/24/2024   HCT 39.0 01/24/2024   PLT 299 01/24/2024    Lab Results  Component Value Date   NA 133 (L) 01/24/2024   K 4.2 01/24/2024   CL 100 01/24/2024   CO2 22 01/24/2024   BUN 34 (H) 01/24/2024   CREATININE 2.09 (H) 01/24/2024   CALCIUM 8.4 (L) 01/24/2024    No results found for: "INR", "APTT"   Urine Culture: @[id number] (laburin,org,[id number])@   IMAGING: US SCROTUM W/DOPPLER Result Date: 01/24/2024 CLINICAL DATA:  Testicular pain. EXAM: SCROTAL ULTRASOUND DOPPLER ULTRASOUND OF THE TESTICLES TECHNIQUE: Complete ultrasound examination of the testicles, epididymis, and other scrotal structures was performed. Color and spectral Doppler ultrasound were also utilized to evaluate blood flow to the testicles. COMPARISON:  None Available. FINDINGS: Right testicle Measurements: 3.5 cm x 2.0 cm x 2.7 cm. No mass or microlithiasis visualized. Left  testicle Measurements: 3.1 cm x 1.9 cm x 2.9 cm. No mass or microlithiasis visualized. Right epididymis:  Normal in size and appearance. Left epididymis:  Normal in size and appearance. Hydrocele:  There is a small left-sided hydrocele. Varicocele:  Bilateral varicoceles are noted. Pulsed Doppler interrogation of both testes demonstrates normal low resistance arterial and venous waveforms bilaterally. IMPRESSION: 1. Small left-sided hydrocele. 2. Bilateral varicoceles. Electronically Signed   By: Aram Candela M.D.   On: 01/24/2024 02:17   CT Renal Stone Study Result Date: 01/23/2024 CLINICAL DATA:  Bilateral flank pain, hematuria since Monday, history of urinary tract calculi EXAM: CT ABDOMEN AND PELVIS WITHOUT CONTRAST TECHNIQUE: Multidetector CT imaging of the abdomen and pelvis was performed following the standard protocol without IV contrast. RADIATION DOSE REDUCTION: This exam was performed according  to the departmental dose-optimization program which includes automated exposure control, adjustment of the mA and/or kV according to patient size and/or use of iterative reconstruction technique. COMPARISON:  10/17/2023 FINDINGS: Lower chest: No acute pleural or parenchymal lung disease. Hepatobiliary: Stable hepatic cyst at the dome of the liver. Otherwise unremarkable unenhanced appearance of the liver and gallbladder. No biliary duct dilation. Pancreas: Unremarkable unenhanced appearance. Spleen: Unremarkable unenhanced appearance. Adrenals/Urinary Tract: The bladder is decompressed, limiting its evaluation. There is extensive bladder wall thickening and marked perivesicular fat stranding, consistent with cystitis. There is severe bilateral hydronephrosis and hydroureter, right greater than left, not appreciably changed since prior study. Stable renal cysts do not require specific imaging follow-up. No urinary tract calculi. The adrenals are unremarkable. Stomach/Bowel: No bowel obstruction or ileus. The  appendix is surgically absent. No bowel wall thickening or inflammatory change. Vascular/Lymphatic: Aortic atherosclerosis. Numerous subcentimeter retroperitoneal lymph nodes are likely reactive. No pathologic adenopathy. Reproductive: Stable enlargement of the prostate, measuring 4.4 x 5.0 by 5.5 cm. Central prostate calcifications are again noted. Other: No free fluid or free intraperitoneal gas. No abdominal wall hernia. Musculoskeletal: No acute or destructive bony abnormalities. Reconstructed images demonstrate no additional findings. IMPRESSION: 1. Decompression urinary bladder, with marked bladder wall thickening and severe perivesicular fat stranding concerning for cystitis. This may be superimposed upon chronic underlying bladder outlet obstruction given the enlarged prostate. Correlation with urinalysis recommended. 2. Severe bilateral hydronephrosis and hydroureter, right greater than left, unchanged since prior exam. This is of uncertain etiology. 3.  Aortic Atherosclerosis (ICD10-I70.0). Electronically Signed   By: Sharlet Salina M.D.   On: 01/23/2024 22:33    ------  Elmon Kirschner, NP Pager: [phone]   Please contact the urology consult pager with any further questions/concerns.  I have seen and examined the patient and agree with the above assessment and plan.  He is a patient of Dr. Berneice Heinrich and summary of recent visit pasted below:  1 - Very Elevated PSA, Probably Metastatic Prostate Cancer - 12/12 cores up to 95% grade 5 cancer on BX 12/2023 on eval PSA 213. Brother with prostate cancer. DRE 09/2023 with Rt side nodular and fixed. 11/2023 TRUS BX 51 mL, tiny median, very fixed. CT with shoddy <2cm adenopathy from aortoa to pelvis.   2- Gross Hematuria - New gross hematuira 08/2023. 40PY smoker. STI screen negative at PCP. Very elevated PSA as per above. CT 11/2023 with bilat hydro to bladder, no upper tract masses. cysto pending.   3 - Stage 3b Renal Insufficiency / Likely  Malgin[place] Ureteral Obstruction - Cr 1.8's 2024 on PCP labs. Ct 11/2023 with bilateral hydro to bladder neck / prostate.   4 - Non-Complex Left Renal Cysts - LLP 3cm, LUP 2cm minimally complex cysts w/o mass effect on stone CT 2021   5 - Urolithiasis- medical passage x several. CT 2025 stone free.   6 - Trichomonas - incidental trich on UA 09/2023. Flagyl course RX;d.   7 - Lower Urinary Tract Symptoms / Incomplete Bladder Emptying - progressive bother from irritative and obstructive LUTS. PVR 2024. DRE 40gm. Starting tamsulosin.   PMH sig for open appy. 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 HIs PCP is Veatrice Kells NP with [facility]   He reports yesterday he had significant gross hematuria with small passage of clots that has improved. Racked urine at bedside is Hospital doctor. He currently denies flank pain or abdominal  pain. He is voiding with frequency. CT reviewed with decompressed bladder and no clots visible. Stable bilateral hydroureteronephrosis with AKI.  -Regarding gross hematuria, likely from prostate, newly diagnosed metastatic prostate. Possibility of underlying bladder lesion and patient has cysto/TURP/bilateral retrogrades scheduled with Dr. Berneice Heinrich next week.   -Continue observation. Rack urine overnight at bedside. Continue bladder scans. No need for indwelling foley now and he prefers not to have catheter. -Still plan on cysto/TURP/retrogrades as scheduled next week. -No need for urgent stent placement -Will notify Dr. Berneice Heinrich of his admission. -Following  Matt R. Syanna Remmert MD [facility]  Pager: [phone]

## 2024-01-24 NOTE — Plan of Care (Signed)

## 2024-01-25 DIAGNOSIS — N390 Urinary tract infection, site not specified: Secondary | ICD-10-CM | POA: Diagnosis not present

## 2024-01-25 LAB — CBC
HCT: 38.7 % — ABNORMAL LOW (ref 39.0–52.0)
Hemoglobin: 12.3 g/dL — ABNORMAL LOW (ref 13.0–17.0)
MCH: 26.6 pg (ref 26.0–34.0)
MCHC: 31.8 g/dL (ref 30.0–36.0)
MCV: 83.8 fL (ref 80.0–100.0)
Platelets: 303 10*3/uL (ref 150–400)
RBC: 4.62 MIL/uL (ref 4.22–5.81)
RDW: 13.2 % (ref 11.5–15.5)
WBC: 8.6 10*3/uL (ref 4.0–10.5)
nRBC: 0 % (ref 0.0–0.2)

## 2024-01-25 LAB — COMPREHENSIVE METABOLIC PANEL
ALT: 23 U/L (ref 0–44)
AST: 32 U/L (ref 15–41)
Albumin: 3.5 g/dL (ref 3.5–5.0)
Alkaline Phosphatase: 51 U/L (ref 38–126)
Anion gap: 7 (ref 5–15)
BUN: 32 mg/dL — ABNORMAL HIGH (ref 6–20)
CO2: 25 mmol/L (ref 22–32)
Calcium: 8.7 mg/dL — ABNORMAL LOW (ref 8.9–10.3)
Chloride: 105 mmol/L (ref 98–111)
Creatinine, Ser: 2.18 mg/dL — ABNORMAL HIGH (ref 0.61–1.24)
GFR, Estimated: 35 mL/min — ABNORMAL LOW (ref >60)
Glucose, Bld: 100 mg/dL — ABNORMAL HIGH (ref 70–99)
Potassium: 3.9 mmol/L (ref 3.5–5.1)
Sodium: 137 mmol/L (ref 135–145)
Total Bilirubin: 0.6 mg/dL (ref 0.0–1.2)
Total Protein: 7.4 g/dL (ref 6.5–8.1)

## 2024-01-25 LAB — HIV ANTIBODY (ROUTINE TESTING W REFLEX): HIV Screen 4th Generation wRfx: NONREACTIVE

## 2024-01-25 MED ORDER — DEGARELIX ACETATE(240 MG DOSE) 120 MG/VIAL ~~LOC~~ SOLR
240.0000 mg | Freq: Once | SUBCUTANEOUS | Status: AC
Start: 2024-01-25 — End: 2024-01-25
  Administered 2024-01-25: 240 mg via SUBCUTANEOUS
  Filled 2024-01-25: qty 6

## 2024-01-25 MED ORDER — AMLODIPINE BESYLATE 5 MG PO TABS
5.0000 mg | ORAL_TABLET | Freq: Every day | ORAL | Status: DC
  Administered 2024-01-25: 5 mg via ORAL
  Filled 2024-01-25: qty 1

## 2024-01-25 MED ORDER — AMLODIPINE BESYLATE 5 MG PO TABS: 5.0000 mg | ORAL_TABLET | Freq: Every day | ORAL | 0 refills | Status: DC

## 2024-01-25 MED ORDER — AMLODIPINE BESYLATE 10 MG PO TABS: 10.0000 mg | ORAL_TABLET | Freq: Every day | ORAL | Status: DC

## 2024-01-25 NOTE — Discharge Summary (Signed)
 Physician Discharge Summary  Adam Mcguire:[id number] DOB: 1966-03-03 DOA: 01/23/2024  PCP: Patient, No Pcp Per  Admit date: 01/23/2024 Discharge date: 01/25/2024  Admitted From: home Discharge disposition: home   Recommendations for Outpatient Follow-Up:   Close follow up with PCP for BP medication adjustment   Discharge Diagnosis:   Principal Problem:   Acute UTI Active Problems:   Mixed hyperlipidemia   AKI (acute kidney injury) (HCC)   Bilateral hydronephrosis   Aortic atherosclerosis (HCC)   Hematuria   Class 1 obesity   Flank pain with history of urolithiasis    Discharge Condition: Improved.  Diet recommendation: Low sodium, heart healthy.   Wound care: None.  Code status: Full.   History of Present Illness:   Adam Mcguire is a 58 y.o. male with medical history significant of nephrolithiasis who presented to the emergency department complaints of bilateral flank pain associated with urgency, frequency, mild dysuria and hematuria since Monday.  He was seen at [facility] urology and was given a prescription for oral levofloxacin.  However, he states that his symptoms have become a lot worse since then.  He is on schedule to get TURP next week.  No emesis, diarrhea, constipation, melena or hematochezia.  He denied fever, chills, rhinorrhea, sore throat, wheezing or hemoptysis.  No chest pain, palpitations, diaphoresis, PND, orthopnea or pitting edema of the lower extremities.  No polyuria, polydipsia, polyphagia or blurred vision.    Lab work: Urine analysis was red and turbid due to hematuria showing rare bacteria, greater than 50 RBC and 11-20 WBC.  CBC showed a white count of 12.5, hemoglobin 13.8 g/dL platelets 956.  CMP showed normal electrolytes, glucose of 128, BUN 29 and creatinine 2.18 mg/dL.  Total protein is 8.4 g/dL, the rest of the LFTs were normal.  Lactic acid 1.1 mmol/L.   Imaging: CT renal study showing decompression urinary bladder, with  moderate bladder wall thickening and severe perivesicular fat stranding concerning for cystitis.  This may be superimposed upon chronic underlying bladder outlet obstruction given the enlarged prostate.  Correlation with urinalysis recommended.  Severe bilateral hydronephrosis and hydroureter, right greater than left, unchanged since prior exam.  This is of uncertain etiology.  Aortic atherosclerosis.   Hospital Course by Problem:   hematuria -Patient reports that his hematuria has been improving consistently -urology consulted -keep appt for TURP on 3/26  HTN -start norvasc -encourage low salt diet  Medical Consultants:      Discharge Exam:   Vitals:   01/25/24 1000 01/25/24 1131  BP: (!) 188/103 (!) 153/96  Pulse:  (!) 59  Resp:    Temp:    SpO2:     Vitals:   01/25/24 0231 01/25/24 0552 01/25/24 1000 01/25/24 1131  BP: 137/83 (!) 179/94 (!) 188/103 (!) 153/96  Pulse:  66  (!) 59  Resp:  14    Temp:  98.8 F (37.1 C)    TempSrc:  Oral    SpO2:  97%    Weight:      Height:        General exam: Appears calm and comfortable- wants to go home   The results of significant diagnostics from this hospitalization (including imaging, microbiology, ancillary and laboratory) are listed below for reference.     Procedures and Diagnostic Studies:   US SCROTUM W/DOPPLER Result Date: 01/24/2024 CLINICAL DATA:  Testicular pain. EXAM: SCROTAL ULTRASOUND DOPPLER ULTRASOUND OF THE TESTICLES TECHNIQUE: Complete ultrasound examination of the testicles, epididymis,  and other scrotal structures was performed. Color and spectral Doppler ultrasound were also utilized to evaluate blood flow to the testicles. COMPARISON:  None Available. FINDINGS: Right testicle Measurements: 3.5 cm x 2.0 cm x 2.7 cm. No mass or microlithiasis visualized. Left testicle Measurements: 3.1 cm x 1.9 cm x 2.9 cm. No mass or microlithiasis visualized. Right epididymis:  Normal in size and appearance. Left  epididymis:  Normal in size and appearance. Hydrocele:  There is a small left-sided hydrocele. Varicocele:  Bilateral varicoceles are noted. Pulsed Doppler interrogation of both testes demonstrates normal low resistance arterial and venous waveforms bilaterally. IMPRESSION: 1. Small left-sided hydrocele. 2. Bilateral varicoceles. Electronically Signed   By: Aram Candela M.D.   On: 01/24/2024 02:17   CT Renal Stone Study Result Date: 01/23/2024 CLINICAL DATA:  Bilateral flank pain, hematuria since Monday, history of urinary tract calculi EXAM: CT ABDOMEN AND PELVIS WITHOUT CONTRAST TECHNIQUE: Multidetector CT imaging of the abdomen and pelvis was performed following the standard protocol without IV contrast. RADIATION DOSE REDUCTION: This exam was performed according to the departmental dose-optimization program which includes automated exposure control, adjustment of the mA and/or kV according to patient size and/or use of iterative reconstruction technique. COMPARISON:  10/17/2023 FINDINGS: Lower chest: No acute pleural or parenchymal lung disease. Hepatobiliary: Stable hepatic cyst at the dome of the liver. Otherwise unremarkable unenhanced appearance of the liver and gallbladder. No biliary duct dilation. Pancreas: Unremarkable unenhanced appearance. Spleen: Unremarkable unenhanced appearance. Adrenals/Urinary Tract: The bladder is decompressed, limiting its evaluation. There is extensive bladder wall thickening and marked perivesicular fat stranding, consistent with cystitis. There is severe bilateral hydronephrosis and hydroureter, right greater than left, not appreciably changed since prior study. Stable renal cysts do not require specific imaging follow-up. No urinary tract calculi. The adrenals are unremarkable. Stomach/Bowel: No bowel obstruction or ileus. The appendix is surgically absent. No bowel wall thickening or inflammatory change. Vascular/Lymphatic: Aortic atherosclerosis. Numerous  subcentimeter retroperitoneal lymph nodes are likely reactive. No pathologic adenopathy. Reproductive: Stable enlargement of the prostate, measuring 4.4 x 5.0 by 5.5 cm. Central prostate calcifications are again noted. Other: No free fluid or free intraperitoneal gas. No abdominal wall hernia. Musculoskeletal: No acute or destructive bony abnormalities. Reconstructed images demonstrate no additional findings. IMPRESSION: 1. Decompression urinary bladder, with marked bladder wall thickening and severe perivesicular fat stranding concerning for cystitis. This may be superimposed upon chronic underlying bladder outlet obstruction given the enlarged prostate. Correlation with urinalysis recommended. 2. Severe bilateral hydronephrosis and hydroureter, right greater than left, unchanged since prior exam. This is of uncertain etiology. 3.  Aortic Atherosclerosis (ICD10-I70.0). Electronically Signed   By: Sharlet Salina M.D.   On: 01/23/2024 22:33     Labs:   Basic Metabolic Panel: Recent Labs  Lab 01/23/24 2145 01/24/24 0426 01/25/24 0549  NA 135 133* 137  K 4.1 4.2 3.9  CL 101 100 105  CO2 24 22 25   GLUCOSE 128* 116* 100*  BUN 29* 34* 32*  CREATININE 2.18* 2.09* 2.18*  CALCIUM 9.1 8.4* 8.7*   GFR Estimated Creatinine Clearance: 40.6 mL/min (A) (by C-G formula based on SCr of 2.18 mg/dL (H)). Liver Function Tests: Recent Labs  Lab 01/23/24 2145 01/25/24 0549  AST 27 32  ALT 23 23  ALKPHOS 64 51  BILITOT 0.7 0.6  PROT 8.4* 7.4  ALBUMIN 3.9 3.5   No results for input(s): "LIPASE", "AMYLASE" in the last 168 hours. No results for input(s): "AMMONIA" in the last 168 hours. Coagulation profile No  results for input(s): "INR", "PROTIME" in the last 168 hours.  CBC: Recent Labs  Lab 01/23/24 2145 01/24/24 0426 01/25/24 0549  WBC 12.5* 11.7* 8.6  NEUTROABS 9.0*  --   --   HGB 13.8 12.7* 12.3*  HCT 42.6 39.0 38.7*  MCV 81.5 80.9 83.8  PLT 340 299 303   Cardiac Enzymes: No results  for input(s): "CKTOTAL", "CKMB", "CKMBINDEX", "TROPONINI" in the last 168 hours. BNP: Invalid input(s): "POCBNP" CBG: No results for input(s): "GLUCAP" in the last 168 hours. D-Dimer No results for input(s): "DDIMER" in the last 72 hours. Hgb A1c No results for input(s): "HGBA1C" in the last 72 hours. Lipid Profile No results for input(s): "CHOL", "HDL", "LDLCALC", "TRIG", "CHOLHDL", "LDLDIRECT" in the last 72 hours. Thyroid function studies No results for input(s): "TSH", "T4TOTAL", "T3FREE", "THYROIDAB" in the last 72 hours.  Invalid input(s): "FREET3" Anemia work up No results for input(s): "VITAMINB12", "FOLATE", "FERRITIN", "TIBC", "IRON", "RETICCTPCT" in the last 72 hours. Microbiology Recent Results (from the past 240 hours)  Urine Culture (for pregnant, neutropenic or urologic patients or patients with an indwelling urinary catheter)     Status: None   Collection Time: 01/24/24  1:02 AM   Specimen: Urine, Clean Catch  Result Value Ref Range Status   Specimen Description   Final    URINE, CLEAN CATCH Performed at [facility], [address] [place], [place] [address]    Special Requests   Final    NONE Performed at [facility], [address] [place], [place] [address]    Culture   Final    NO GROWTH Performed at [facility], [address], [place], [place] [address]    Report Status 01/24/2024 FINAL  Final  Blood culture (routine x 2)     Status: None (Preliminary result)   Collection Time: 01/24/24  1:27 AM   Specimen: BLOOD RIGHT FOREARM  Result Value Ref Range Status   Specimen Description   Final    BLOOD RIGHT FOREARM Performed at [facility], [address] [place], [place] [address]    Special Requests   Final    BOTTLES DRAWN AEROBIC AND ANAEROBIC Blood Culture results may not be optimal due to an inadequate volume of blood received in culture bottles Performed at [facility], [address] [place], [place] [address]    Culture   Final    NO GROWTH 1 DAY Performed at [facility], [address], [place], [place] [address]    Report Status PENDING  Incomplete  Blood culture (routine x 2)     Status: None (Preliminary result)   Collection Time: 01/24/24  1:34 AM   Specimen: Left Antecubital; Blood  Result Value Ref Range Status   Specimen Description   Final    LEFT ANTECUBITAL Performed at [facility], [address], [place], [place] [address]    Special Requests   Final    BOTTLES DRAWN AEROBIC AND ANAEROBIC Blood Culture results may not be optimal due to an inadequate volume of blood received in culture bottles Performed at [facility], [address], [place], [place] [address]    Culture   Final    NO GROWTH 1 DAY Performed at [facility], [address], [place], [place] [address]    Report Status PENDING  Incomplete     Discharge Instructions:   Discharge Instructions  Diet - low sodium heart healthy   Complete by: As directed    Discharge instructions   Complete by: As directed    Would get BP cuff and monitor BP at home-- bring log to PCP   Increase activity slowly   Complete by: As directed       Allergies as of 01/25/2024       Reactions   Vicodin [hydrocodone-acetaminophen] Nausea And Vomiting   Patient can tolerate acetaminiphen        Medication List     PAUSE taking these medications    Xtandi 80 MG tablet Wait to take this until your doctor or other care provider tells you to start again. Generic drug: enzalutamide Take 80 mg by mouth daily.       STOP taking these medications    cephALEXin 500 MG capsule Commonly known as: KEFLEX   methocarbamol 500 MG tablet Commonly known as: ROBAXIN   metroNIDAZOLE 500 MG tablet Commonly known as: FLAGYL   ondansetron 4 MG disintegrating tablet Commonly known as: Zofran ODT    oxyCODONE-acetaminophen 5-325 MG tablet Commonly known as: Percocet   propranolol 80 MG 24 hr capsule Commonly known as: INNOPRAN XL   tamsulosin 0.4 MG Caps capsule Commonly known as: FLOMAX       TAKE these medications    amLODipine 5 MG tablet Commonly known as: NORVASC Take 1 tablet (5 mg total) by mouth daily. Start taking on: January 26, 2024   cyclobenzaprine 10 MG tablet Commonly known as: FLEXERIL Take 10 mg by mouth at bedtime.   levofloxacin 750 MG tablet Commonly known as: LEVAQUIN Take 750 mg by mouth daily.   Orgovyx 120 MG tablet Generic drug: relugolix Take 120 mg by mouth daily.          Time coordinating discharge: 45 min  Signed:  Joseph Art DO  [facility] 01/25/2024, 12:51 PM

## 2024-01-25 NOTE — Plan of Care (Signed)
  Problem: Education: Goal: Knowledge of General Education information will improve Description: Including pain rating scale, medication(s)/side effects and non-pharmacologic comfort measures Outcome: Progressing   Problem: Health Behavior/Discharge Planning: Goal: Ability to manage health-related needs will improve Outcome: Progressing   Problem: Clinical Measurements: Goal: Ability to maintain clinical measurements within normal limits will improve Outcome: Progressing Goal: Diagnostic test results will improve Outcome: Progressing Goal: Cardiovascular complication will be avoided Outcome: Progressing   Problem: Activity: Goal: Risk for activity intolerance will decrease Outcome: Progressing   Problem: Nutrition: Goal: Adequate nutrition will be maintained Outcome: Progressing   Problem: Coping: Goal: Level of anxiety will decrease Outcome: Progressing   Problem: Elimination: Goal: Will not experience complications related to urinary retention Outcome: Progressing   Problem: Pain Managment: Goal: General experience of comfort will improve and/or be controlled Outcome: Progressing   Problem: Safety: Goal: Ability to remain free from injury will improve Outcome: Progressing

## 2024-01-25 NOTE — Progress Notes (Signed)
 Subjective: NAEON. Urine clear. Accompanied by wife. Reviewed case and plan.  Objective: Vital signs in last 24 hours: Temp:  [98.1 F (36.7 C)-98.8 F (37.1 C)] 98.8 F (37.1 C) (03/21 0552) Pulse Rate:  [59-89] 66 (03/21 0552) Resp:  [14-18] 14 (03/21 0552) BP: (113-188)/(78-103) 188/103 (03/21 1000) SpO2:  [92 %-100 %] 97 % (03/21 0552) Weight:  [90.7 kg] 90.7 kg (03/20 1351)  Assessment/Plan: #hematuria Patient reports that his hematuria has been improving consistently.  We discussed that hemorrhagic cystitis would probably take longer than 2 days to clear completely.    Rack and stack overnight shows clear yellow urine with a tiny amount of blood tinge. Now totally clear. Complete ABX as ordered from clinic. OK to discharge from urologic perspective.    Keep appt for TURP on 3/26 Intake/Output from previous day: 03/20 0701 - 03/21 0700 In: 100 [IV Piggyback:100] Out: 200 [Urine:200]  Intake/Output this shift: No intake/output data recorded.  Physical Exam:  General: Alert and oriented CV: No cyanosis Lungs: equal chest rise Abdomen: Soft, NTND, no rebound or guarding Gu: clear yellow urine  Lab Results: Recent Labs    01/23/24 2145 01/24/24 0426 01/25/24 0549  HGB 13.8 12.7* 12.3*  HCT 42.6 39.0 38.7*   BMET Recent Labs    01/24/24 0426 01/25/24 0549  NA 133* 137  K 4.2 3.9  CL 100 105  CO2 22 25  GLUCOSE 116* 100*  BUN 34* 32*  CREATININE 2.09* 2.18*  CALCIUM 8.4* 8.7*     Studies/Results: US SCROTUM W/DOPPLER Result Date: 01/24/2024 CLINICAL DATA:  Testicular pain. EXAM: SCROTAL ULTRASOUND DOPPLER ULTRASOUND OF THE TESTICLES TECHNIQUE: Complete ultrasound examination of the testicles, epididymis, and other scrotal structures was performed. Color and spectral Doppler ultrasound were also utilized to evaluate blood flow to the testicles. COMPARISON:  None Available. FINDINGS: Right testicle Measurements: 3.5 cm x 2.0 cm x 2.7 cm. No mass or  microlithiasis visualized. Left testicle Measurements: 3.1 cm x 1.9 cm x 2.9 cm. No mass or microlithiasis visualized. Right epididymis:  Normal in size and appearance. Left epididymis:  Normal in size and appearance. Hydrocele:  There is a small left-sided hydrocele. Varicocele:  Bilateral varicoceles are noted. Pulsed Doppler interrogation of both testes demonstrates normal low resistance arterial and venous waveforms bilaterally. IMPRESSION: 1. Small left-sided hydrocele. 2. Bilateral varicoceles. Electronically Signed   By: Aram Candela M.D.   On: 01/24/2024 02:17   CT Renal Stone Study Result Date: 01/23/2024 CLINICAL DATA:  Bilateral flank pain, hematuria since Monday, history of urinary tract calculi EXAM: CT ABDOMEN AND PELVIS WITHOUT CONTRAST TECHNIQUE: Multidetector CT imaging of the abdomen and pelvis was performed following the standard protocol without IV contrast. RADIATION DOSE REDUCTION: This exam was performed according to the departmental dose-optimization program which includes automated exposure control, adjustment of the mA and/or kV according to patient size and/or use of iterative reconstruction technique. COMPARISON:  10/17/2023 FINDINGS: Lower chest: No acute pleural or parenchymal lung disease. Hepatobiliary: Stable hepatic cyst at the dome of the liver. Otherwise unremarkable unenhanced appearance of the liver and gallbladder. No biliary duct dilation. Pancreas: Unremarkable unenhanced appearance. Spleen: Unremarkable unenhanced appearance. Adrenals/Urinary Tract: The bladder is decompressed, limiting its evaluation. There is extensive bladder wall thickening and marked perivesicular fat stranding, consistent with cystitis. There is severe bilateral hydronephrosis and hydroureter, right greater than left, not appreciably changed since prior study. Stable renal cysts do not require specific imaging follow-up. No urinary tract calculi. The adrenals are  unremarkable. Stomach/Bowel: No  bowel obstruction or ileus. The appendix is surgically absent. No bowel wall thickening or inflammatory change. Vascular/Lymphatic: Aortic atherosclerosis. Numerous subcentimeter retroperitoneal lymph nodes are likely reactive. No pathologic adenopathy. Reproductive: Stable enlargement of the prostate, measuring 4.4 x 5.0 by 5.5 cm. Central prostate calcifications are again noted. Other: No free fluid or free intraperitoneal gas. No abdominal wall hernia. Musculoskeletal: No acute or destructive bony abnormalities. Reconstructed images demonstrate no additional findings. IMPRESSION: 1. Decompression urinary bladder, with marked bladder wall thickening and severe perivesicular fat stranding concerning for cystitis. This may be superimposed upon chronic underlying bladder outlet obstruction given the enlarged prostate. Correlation with urinalysis recommended. 2. Severe bilateral hydronephrosis and hydroureter, right greater than left, unchanged since prior exam. This is of uncertain etiology. 3.  Aortic Atherosclerosis (ICD10-I70.0). Electronically Signed   By: Sharlet Salina M.D.   On: 01/23/2024 22:33      LOS: 1 day   Elmon Kirschner, NP [facility] Pager: [phone]  01/25/2024, 10:43 AM

## 2024-01-25 NOTE — Progress Notes (Signed)
 Chaplain met with Adam Mcguire and his significant other. They are coping as best they can at this time.  His sister-in-law Thomasene Lot, also received a cancer diagnosis recently.  They requested prayer for her as well as for them. They plan to continue to process and take one thing at a time.

## 2024-01-25 NOTE — Progress Notes (Signed)
   01/25/24 0921  TOC Brief Assessment  Insurance and Status Reviewed  Patient has primary care physician No (No PCP in file, pt has commercial insurance)  Home environment has been reviewed resides in private residence  Prior level of function: Independent  Prior/Current Home Services No current home services  Social Drivers of Health Review SDOH reviewed no interventions necessary  Readmission risk has been reviewed Yes  Transition of care needs no transition of care needs at this time

## 2024-01-25 NOTE — Progress Notes (Signed)
 Subjective/Chief Complaint:  1 - Very Elevated PSA, Probably Metastatic Prostate Cancer - 12/12 cores up to 95% grade 5 cancer on BX 12/2203 on eval PSA 213. Brother with prostate cancer. DRE 09/2023 with Rt side nodular and fixed. 11/2023 TRUS BX 51 mL, tiny median, very fixed. CT with shoddy <2cm adenopathy from aortoa to pelvis. Initial plan was Orgovyx start but he has not received.   2- Gross Hematuria - New gross hematuira since 08/2023. 40PY smoker. STI screen negative at PCP. Very elevated PSA as per above. CT 11/2023 with bilat hydro to bladder, no upper tract masses. cysto pending.   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 Ct 11/2023 with bilateral hydro to bladder neck / prostate.   4 - Lower Urinary Tract Symptoms / Incomplete Bladder Emptying - progressive bother from irritative and obstructive LUTS. PVR 2024. DRE 40gm. Starting tamsulosin. TURP pending for 01/30/24.   PMH sig for open appy. 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 HIs PCP is Veatrice Kells NP with [facility]   Today "Tyron" is stable. Cr low 2's with normal K and volume status. No clot retention.  He unfortunately has not yet started androgen deprivaiton, which is the single most important therapeutic move.   Objective: Vital signs in last 24 hours: Temp:  [98.1 F (36.7 C)-98.8 F (37.1 C)] 98.8 F (37.1 C) (03/21 0552) Pulse Rate:  [59-89] 66 (03/21 0552) Resp:  [14-18] 14 (03/21 0552) BP: (110-188)/(72-103) 188/103 (03/21 1000) SpO2:  [92 %-100 %] 97 % (03/21 0552) Weight:  [90.7 kg] 90.7 kg (03/20 1351) Last BM Date :  (PTA)  Intake/Output from previous day: 03/20 0701 - 03/21 0700 In: 100 [IV Piggyback:100] Out: 200 [Urine:200] Intake/Output this shift: No intake/output data recorded.  Very pleasant, joking, wife at  bedside Non-labored breathing on RA RRR SNTND NO foley No c/c/e.   Lab Results:  Recent Labs    01/24/24 0426 01/25/24 0549  WBC 11.7* 8.6  HGB 12.7* 12.3*  HCT 39.0 38.7*  PLT 299 303   BMET Recent Labs    01/24/24 0426 01/25/24 0549  NA 133* 137  K 4.2 3.9  CL 100 105  CO2 22 25  GLUCOSE 116* 100*  BUN 34* 32*  CREATININE 2.09* 2.18*  CALCIUM 8.4* 8.7*   PT/INR No results for input(s): "LABPROT", "INR" in the last 72 hours. ABG No results for input(s): "PHART", "HCO3" in the last 72 hours.  Invalid input(s): "PCO2", "PO2"  Studies/Results: US SCROTUM W/DOPPLER Result Date: 01/24/2024 CLINICAL DATA:  Testicular pain. EXAM: SCROTAL ULTRASOUND DOPPLER ULTRASOUND OF THE TESTICLES TECHNIQUE: Complete ultrasound examination of the testicles, epididymis, and other scrotal structures was performed. Color and spectral Doppler ultrasound were also utilized to evaluate blood flow to the testicles. COMPARISON:  None Available. FINDINGS: Right testicle Measurements: 3.5 cm x 2.0 cm x 2.7 cm. No mass or microlithiasis visualized. Left testicle Measurements: 3.1 cm x 1.9 cm x 2.9 cm. No mass or microlithiasis visualized. Right epididymis:  Normal in size and appearance. Left epididymis:  Normal in size and appearance. Hydrocele:  There is a small left-sided hydrocele. Varicocele:  Bilateral varicoceles are noted. Pulsed Doppler interrogation of both testes demonstrates normal low resistance arterial and venous waveforms bilaterally. IMPRESSION: 1. Small left-sided hydrocele. 2. Bilateral varicoceles. Electronically Signed   By: Aram Candela  M.D.   On: 01/24/2024 02:17   CT Renal Stone Study Result Date: 01/23/2024 CLINICAL DATA:  Bilateral flank pain, hematuria since Monday, history of urinary tract calculi EXAM: CT ABDOMEN AND PELVIS WITHOUT CONTRAST TECHNIQUE: Multidetector CT imaging of the abdomen and pelvis was performed following the standard protocol without IV contrast.  RADIATION DOSE REDUCTION: This exam was performed according to the departmental dose-optimization program which includes automated exposure control, adjustment of the mA and/or kV according to patient size and/or use of iterative reconstruction technique. COMPARISON:  10/17/2023 FINDINGS: Lower chest: No acute pleural or parenchymal lung disease. Hepatobiliary: Stable hepatic cyst at the dome of the liver. Otherwise unremarkable unenhanced appearance of the liver and gallbladder. No biliary duct dilation. Pancreas: Unremarkable unenhanced appearance. Spleen: Unremarkable unenhanced appearance. Adrenals/Urinary Tract: The bladder is decompressed, limiting its evaluation. There is extensive bladder wall thickening and marked perivesicular fat stranding, consistent with cystitis. There is severe bilateral hydronephrosis and hydroureter, right greater than left, not appreciably changed since prior study. Stable renal cysts do not require specific imaging follow-up. No urinary tract calculi. The adrenals are unremarkable. Stomach/Bowel: No bowel obstruction or ileus. The appendix is surgically absent. No bowel wall thickening or inflammatory change. Vascular/Lymphatic: Aortic atherosclerosis. Numerous subcentimeter retroperitoneal lymph nodes are likely reactive. No pathologic adenopathy. Reproductive: Stable enlargement of the prostate, measuring 4.4 x 5.0 by 5.5 cm. Central prostate calcifications are again noted. Other: No free fluid or free intraperitoneal gas. No abdominal wall hernia. Musculoskeletal: No acute or destructive bony abnormalities. Reconstructed images demonstrate no additional findings. IMPRESSION: 1. Decompression urinary bladder, with marked bladder wall thickening and severe perivesicular fat stranding concerning for cystitis. This may be superimposed upon chronic underlying bladder outlet obstruction given the enlarged prostate. Correlation with urinalysis recommended. 2. Severe bilateral  hydronephrosis and hydroureter, right greater than left, unchanged since prior exam. This is of uncertain etiology. 3.  Aortic Atherosclerosis (ICD10-I70.0). Electronically Signed   By: Sharlet Salina M.D.   On: 01/23/2024 22:33    Anti-infectives: Anti-infectives (From admission, onward)    Start     Dose/Rate Route Frequency Ordered Stop   01/24/24 2200  cefTRIAXone (ROCEPHIN) 1 g in sodium chloride 0.9 % 100 mL IVPB        1 g 200 mL/hr over 30 Minutes Intravenous Every 24 hours 01/24/24 1830     01/24/24 0115  cefTRIAXone (ROCEPHIN) 1 g in sodium chloride 0.9 % 100 mL IVPB        1 g 200 mL/hr over 30 Minutes Intravenous  Once 01/24/24 0102 01/24/24 0328       Assessment/Plan:  Pt's hematuria and hydro most likely from locally advanced prostate cancer. His Hgb, volume status, and K are acceptable. This will not really realistically improve until TURP next week and androgen deprivation kicks in. Unfortunately no realistic way to perform sooner. I feel he is OK for DC and proceed with surgery next week as planned.   Firmagon 240mg  x 1 now. This will at least provide quick and reliable initial anrogen deprivaiton and begin to medically optimize prostate cancer parameters.   Please call me directly with questions anytime.   Loletta Parish. 01/25/2024

## 2024-01-28 NOTE — Patient Instructions (Addendum)
 DUE TO COVID-19 ONLY TWO VISITORS  (aged 57 and older)  ARE ALLOWED TO COME WITH YOU AND STAY IN THE WAITING ROOM ONLY DURING PRE OP AND PROCEDURE.   **NO VISITORS ARE ALLOWED IN THE SHORT STAY AREA OR RECOVERY ROOM!!**  IF YOU WILL BE ADMITTED INTO THE HOSPITAL YOU ARE ALLOWED ONLY FOUR SUPPORT PEOPLE DURING VISITATION HOURS ONLY (7 AM -8PM)   The support person(s) must pass our screening, gel in and out, and wear a mask at all times, including in the patient's room. Patients must also wear a mask when staff or their support person are in the room. Visitors GUEST BADGE MUST BE WORN VISIBLY  One adult visitor may remain with you overnight and MUST be in the room by 8 P.M.     Your procedure is scheduled on: 01/30/24   Report to [facility] Main Entrance    Report to admitting at : 9:15 AM   Call this number if you have problems the morning of surgery [phone]   Do not eat food or drink: After Midnight.  FOLLOW BOWEL PREP AND ANY ADDITIONAL PRE OP INSTRUCTIONS YOU RECEIVED FROM YOUR SURGEON'S OFFICE!!!   Oral Hygiene is also important to reduce your risk of infection.                                    Remember - BRUSH YOUR TEETH THE MORNING OF SURGERY WITH YOUR REGULAR TOOTHPASTE  DENTURES WILL BE REMOVED PRIOR TO SURGERY PLEASE DO NOT APPLY "Poly grip" OR ADHESIVES!!!   Do NOT smoke after Midnight   Take these medicines the morning of surgery with A SIP OF WATER: amlodipine,levofloxacin.                              You may not have any metal on your body including hair pins, jewelry, and body piercing             Do not wear lotions, powders, perfumes/cologne, or deodorant              Men may shave face and neck.   Do not bring valuables to the hospital. [employer] IS NOT             RESPONSIBLE   FOR VALUABLES.   Contacts, glasses, or bridgework may not be worn into surgery.   Bring small overnight bag day of surgery.   DO NOT BRING YOUR HOME MEDICATIONS  TO THE HOSPITAL. PHARMACY WILL DISPENSE MEDICATIONS LISTED ON YOUR MEDICATION LIST TO YOU DURING YOUR ADMISSION IN THE HOSPITAL!    Patients discharged on the day of surgery will not be allowed to drive home.  Someone NEEDS to stay with you for the first 24 hours after anesthesia.   Special Instructions: Bring a copy of your healthcare power of attorney and living will documents         the day of surgery if you haven't scanned them before.              Please read over the following fact sheets you were given: IF YOU HAVE QUESTIONS ABOUT YOUR PRE-OP INSTRUCTIONS PLEASE CALL [phone]    [facility] - Preparing for Surgery Before surgery, you can play an important role.  Because skin is not sterile, your skin needs to be as free of germs as possible.  You can reduce the number of germs on your skin by washing with CHG (chlorahexidine gluconate) soap before surgery.  CHG is an antiseptic cleaner which kills germs and bonds with the skin to continue killing germs even after washing. Please DO NOT use if you have an allergy to CHG or antibacterial soaps.  If your skin becomes reddened/irritated stop using the CHG and inform your nurse when you arrive at Short Stay. Do not shave (including legs and underarms) for at least 48 hours prior to the first CHG shower.  You may shave your face/neck. Please follow these instructions carefully:  1.  Shower with CHG Soap the night before surgery and the  morning of Surgery.  2.  If you choose to wash your hair, wash your hair first as usual with your  normal  shampoo.  3.  After you shampoo, rinse your hair and body thoroughly to remove the  shampoo.                           4.  Use CHG as you would any other liquid soap.  You can apply chg directly  to the skin and wash                       Gently with a scrungie or clean washcloth.  5.  Apply the CHG Soap to your body ONLY FROM THE NECK DOWN.   Do not use on face/ open                           Wound or  open sores. Avoid contact with eyes, ears mouth and genitals (private parts).                       Wash face,  Genitals (private parts) with your normal soap.             6.  Wash thoroughly, paying special attention to the area where your surgery  will be performed.  7.  Thoroughly rinse your body with warm water from the neck down.  8.  DO NOT shower/wash with your normal soap after using and rinsing off  the CHG Soap.                9.  Pat yourself dry with a clean towel.            10.  Wear clean pajamas.            11.  Place clean sheets on your bed the night of your first shower and do not  sleep with pets. Day of Surgery : Do not apply any lotions/deodorants the morning of surgery.  Please wear clean clothes to the hospital/surgery center.  FAILURE TO FOLLOW THESE INSTRUCTIONS MAY RESULT IN THE CANCELLATION OF YOUR SURGERY PATIENT SIGNATURE_________________________________  NURSE SIGNATURE__________________________________  ________________________________________________________________________

## 2024-01-29 ENCOUNTER — Encounter (HOSPITAL_COMMUNITY): Payer: Self-pay

## 2024-01-29 ENCOUNTER — Other Ambulatory Visit: Payer: Self-pay

## 2024-01-29 ENCOUNTER — Encounter (HOSPITAL_COMMUNITY)
Admission: RE | Admit: 2024-01-29 | Discharge: 2024-01-29 | Disposition: A | Discharge status: Home / Self Care | Source: Ambulatory Visit | Attending: Urology | Admitting: Urology

## 2024-01-29 DIAGNOSIS — Z01818 Encounter for other preprocedural examination: Secondary | ICD-10-CM | POA: Insufficient documentation

## 2024-01-29 HISTORY — DX: Personal history of urinary calculi: Z87.442

## 2024-01-29 HISTORY — DX: Essential (primary) hypertension: I10

## 2024-01-29 LAB — CULTURE, BLOOD (ROUTINE X 2)
Culture: NO GROWTH
Culture: NO GROWTH

## 2024-01-29 NOTE — Progress Notes (Signed)
 For Anesthesia: PCP - NO PCP Cardiologist - N/A  Bowel Prep reminder:  Chest x-ray -  EKG - 01/25/24 Stress Test -  ECHO -  Cardiac Cath -  Pacemaker/ICD device last checked: Pacemaker orders received: Device Rep notified:  Spinal Cord Stimulator:N/A  Sleep Study - N/A CPAP -   Fasting Blood Sugar - N/A Checks Blood Sugar _____ times a day Date and result of last Hgb A1c-  Last dose of GLP1 agonist- N/A GLP1 instructions:   Last dose of SGLT-2 inhibitors- N/A SGLT-2 instructions:   Blood Thinner Instructions:N/A Aspirin Instructions: Last Dose:  Activity level: Can go up a flight of stairs and activities of daily living without stopping and without chest pain and/or shortness of breath   Able to exercise without chest pain and/or shortness of breath  Anesthesia review: Smoker,HTN  Patient denies shortness of breath, fever, cough and chest pain at PAT appointment   Patient verbalized understanding of instructions that were given to them at the PAT appointment. Patient was also instructed that they will need to review over the PAT instructions again at home before surgery.

## 2024-01-30 ENCOUNTER — Ambulatory Visit (HOSPITAL_COMMUNITY): Admitting: Registered Nurse

## 2024-01-30 ENCOUNTER — Encounter (HOSPITAL_COMMUNITY)
Admission: RE | Disposition: A | Discharge status: Home / Self Care | Payer: Self-pay | Source: Home / Self Care | Attending: Urology

## 2024-01-30 ENCOUNTER — Ambulatory Visit (HOSPITAL_COMMUNITY)

## 2024-01-30 ENCOUNTER — Ambulatory Visit (HOSPITAL_BASED_OUTPATIENT_CLINIC_OR_DEPARTMENT_OTHER): Admitting: Registered Nurse

## 2024-01-30 ENCOUNTER — Observation Stay (HOSPITAL_COMMUNITY)
Admission: RE | Admit: 2024-01-30 | Discharge: 2024-01-31 | Disposition: A | Discharge status: Home / Self Care | Attending: Urology | Admitting: Urology

## 2024-01-30 ENCOUNTER — Encounter (HOSPITAL_COMMUNITY): Payer: Self-pay | Admitting: Urology

## 2024-01-30 DIAGNOSIS — R310 Gross hematuria: Secondary | ICD-10-CM | POA: Diagnosis not present

## 2024-01-30 DIAGNOSIS — N1339 Other hydronephrosis: Secondary | ICD-10-CM | POA: Diagnosis not present

## 2024-01-30 DIAGNOSIS — I10 Essential (primary) hypertension: Secondary | ICD-10-CM | POA: Insufficient documentation

## 2024-01-30 DIAGNOSIS — C61 Malignant neoplasm of prostate: Secondary | ICD-10-CM | POA: Diagnosis present

## 2024-01-30 DIAGNOSIS — F17210 Nicotine dependence, cigarettes, uncomplicated: Secondary | ICD-10-CM | POA: Insufficient documentation

## 2024-01-30 DIAGNOSIS — R972 Elevated prostate specific antigen [PSA]: Secondary | ICD-10-CM | POA: Diagnosis not present

## 2024-01-30 HISTORY — PX: CYSTOSCOPY W/ RETROGRADES: SHX1426

## 2024-01-30 HISTORY — PX: TRANSURETHRAL RESECTION OF PROSTATE: SHX73

## 2024-01-30 LAB — HEMOGLOBIN AND HEMATOCRIT, BLOOD
HCT: 39.1 % (ref 39.0–52.0)
Hemoglobin: 11.9 g/dL — ABNORMAL LOW (ref 13.0–17.0)

## 2024-01-30 SURGERY — TURP (TRANSURETHRAL RESECTION OF PROSTATE)
Anesthesia: General

## 2024-01-30 MED ORDER — DROPERIDOL 2.5 MG/ML IJ SOLN: 0.6250 mg | Freq: Once | INTRAMUSCULAR | Status: DC | PRN

## 2024-01-30 MED ORDER — SODIUM CHLORIDE 0.9 % IR SOLN
Status: DC | PRN
  Administered 2024-01-30: 9000 mL

## 2024-01-30 MED ORDER — FENTANYL CITRATE (PF) 100 MCG/2ML IJ SOLN
INTRAMUSCULAR | Status: AC
  Filled 2024-01-30: qty 2

## 2024-01-30 MED ORDER — OXYCODONE HCL 5 MG PO TABS
5.0000 mg | ORAL_TABLET | Freq: Once | ORAL | Status: AC | PRN
  Administered 2024-01-30: 5 mg via ORAL

## 2024-01-30 MED ORDER — FENTANYL CITRATE PF 50 MCG/ML IJ SOSY
PREFILLED_SYRINGE | INTRAMUSCULAR | Status: AC
Start: 2024-01-30 — End: 2024-01-31
  Filled 2024-01-30: qty 1

## 2024-01-30 MED ORDER — IOHEXOL 300 MG/ML  SOLN
INTRAMUSCULAR | Status: DC | PRN
  Administered 2024-01-30: 20 mL

## 2024-01-30 MED ORDER — SODIUM CHLORIDE 0.9 % IR SOLN
3000.0000 mL | Status: DC
  Administered 2024-01-30 – 2024-01-31 (×2): 3000 mL

## 2024-01-30 MED ORDER — CHLORHEXIDINE GLUCONATE 0.12 % MT SOLN
15.0000 mL | Freq: Once | OROMUCOSAL | Status: AC
  Administered 2024-01-30: 15 mL via OROMUCOSAL

## 2024-01-30 MED ORDER — ACETAMINOPHEN 500 MG PO TABS
1000.0000 mg | ORAL_TABLET | Freq: Once | ORAL | Status: AC
  Administered 2024-01-30: 1000 mg via ORAL
  Filled 2024-01-30: qty 2

## 2024-01-30 MED ORDER — SODIUM CHLORIDE 0.9 % IV SOLN: INTRAVENOUS | Status: DC

## 2024-01-30 MED ORDER — MIDAZOLAM HCL 2 MG/2ML IJ SOLN
INTRAMUSCULAR | Status: DC | PRN
  Administered 2024-01-30 (×2): 1 mg via INTRAVENOUS

## 2024-01-30 MED ORDER — PROPOFOL 10 MG/ML IV BOLUS
INTRAVENOUS | Status: DC | PRN
  Administered 2024-01-30: 30 mg via INTRAVENOUS
  Administered 2024-01-30: 170 mg via INTRAVENOUS

## 2024-01-30 MED ORDER — AMLODIPINE BESYLATE 10 MG PO TABS
5.0000 mg | ORAL_TABLET | Freq: Every day | ORAL | Status: DC
  Administered 2024-01-30 – 2024-01-31 (×2): 5 mg via ORAL
  Filled 2024-01-30 (×2): qty 1

## 2024-01-30 MED ORDER — FENTANYL CITRATE (PF) 100 MCG/2ML IJ SOLN
INTRAMUSCULAR | Status: DC | PRN
  Administered 2024-01-30 (×4): 50 ug via INTRAVENOUS

## 2024-01-30 MED ORDER — OXYCODONE HCL 5 MG/5ML PO SOLN: 5.0000 mg | Freq: Once | ORAL | Status: AC | PRN

## 2024-01-30 MED ORDER — EPHEDRINE 5 MG/ML INJ
INTRAVENOUS | Status: AC
  Filled 2024-01-30: qty 5

## 2024-01-30 MED ORDER — OXYCODONE HCL 5 MG PO TABS
5.0000 mg | ORAL_TABLET | ORAL | Status: DC | PRN
  Administered 2024-01-31 (×2): 5 mg via ORAL
  Filled 2024-01-30 (×4): qty 1

## 2024-01-30 MED ORDER — DEXTROSE 5 % IV SOLN
5.0000 mg/kg | INTRAVENOUS | Status: AC
  Administered 2024-01-30: 430 mg via INTRAVENOUS
  Filled 2024-01-30: qty 10.75

## 2024-01-30 MED ORDER — LIDOCAINE HCL (CARDIAC) PF 100 MG/5ML IV SOSY
PREFILLED_SYRINGE | INTRAVENOUS | Status: DC | PRN
  Administered 2024-01-30: 100 mg via INTRAVENOUS

## 2024-01-30 MED ORDER — DEXMEDETOMIDINE HCL IN NACL 80 MCG/20ML IV SOLN
INTRAVENOUS | Status: DC | PRN
  Administered 2024-01-30: 4 ug via INTRAVENOUS
  Administered 2024-01-30: 8 ug via INTRAVENOUS

## 2024-01-30 MED ORDER — LACTATED RINGERS IV SOLN: INTRAVENOUS | Status: DC

## 2024-01-30 MED ORDER — DEXAMETHASONE SODIUM PHOSPHATE 10 MG/ML IJ SOLN
INTRAMUSCULAR | Status: AC
Start: 2024-01-30 — End: ?
  Filled 2024-01-30: qty 1

## 2024-01-30 MED ORDER — SENNOSIDES-DOCUSATE SODIUM 8.6-50 MG PO TABS
1.0000 | ORAL_TABLET | Freq: Two times a day (BID) | ORAL | Status: DC
  Administered 2024-01-30 – 2024-01-31 (×2): 1 via ORAL
  Filled 2024-01-30 (×2): qty 1

## 2024-01-30 MED ORDER — ONDANSETRON HCL 4 MG/2ML IJ SOLN
INTRAMUSCULAR | Status: AC
Start: 2024-01-30 — End: ?
  Filled 2024-01-30: qty 2

## 2024-01-30 MED ORDER — DEXMEDETOMIDINE HCL IN NACL 80 MCG/20ML IV SOLN
INTRAVENOUS | Status: AC
  Filled 2024-01-30: qty 20

## 2024-01-30 MED ORDER — OXYCODONE HCL 5 MG PO TABS
ORAL_TABLET | ORAL | Status: AC
  Administered 2024-01-30: 5 mg
  Filled 2024-01-30: qty 1

## 2024-01-30 MED ORDER — FENTANYL CITRATE PF 50 MCG/ML IJ SOSY
PREFILLED_SYRINGE | INTRAMUSCULAR | Status: AC
  Filled 2024-01-30: qty 2

## 2024-01-30 MED ORDER — MIDAZOLAM HCL 2 MG/2ML IJ SOLN
INTRAMUSCULAR | Status: AC
  Filled 2024-01-30: qty 2

## 2024-01-30 MED ORDER — FENTANYL CITRATE PF 50 MCG/ML IJ SOSY
25.0000 ug | PREFILLED_SYRINGE | INTRAMUSCULAR | Status: DC | PRN
  Administered 2024-01-30: 50 ug via INTRAVENOUS
  Administered 2024-01-30 (×2): 25 ug via INTRAVENOUS
  Administered 2024-01-30: 50 ug via INTRAVENOUS

## 2024-01-30 MED ORDER — ORAL CARE MOUTH RINSE: 15.0000 mL | Freq: Once | OROMUCOSAL | Status: AC

## 2024-01-30 MED ORDER — ACETAMINOPHEN 500 MG PO TABS
1000.0000 mg | ORAL_TABLET | Freq: Four times a day (QID) | ORAL | Status: AC
  Administered 2024-01-30 – 2024-01-31 (×4): 1000 mg via ORAL
  Filled 2024-01-30 (×4): qty 2

## 2024-01-30 MED ORDER — DEXAMETHASONE SODIUM PHOSPHATE 4 MG/ML IJ SOLN
INTRAMUSCULAR | Status: DC | PRN
  Administered 2024-01-30: 10 mg via INTRAVENOUS

## 2024-01-30 MED ORDER — ONDANSETRON HCL 4 MG/2ML IJ SOLN
INTRAMUSCULAR | Status: DC | PRN
  Administered 2024-01-30: 4 mg via INTRAVENOUS

## 2024-01-30 MED ORDER — EPHEDRINE SULFATE-NACL 50-0.9 MG/10ML-% IV SOSY
PREFILLED_SYRINGE | INTRAVENOUS | Status: DC | PRN
  Administered 2024-01-30 (×2): 5 mg via INTRAVENOUS

## 2024-01-30 MED ORDER — HYDROMORPHONE HCL 2 MG/ML IJ SOLN
INTRAMUSCULAR | Status: AC
  Filled 2024-01-30: qty 1

## 2024-01-30 MED ORDER — HYDROMORPHONE HCL 1 MG/ML IJ SOLN
INTRAMUSCULAR | Status: DC | PRN
  Administered 2024-01-30: 1 mg via INTRAVENOUS
  Administered 2024-01-30: .5 mg via INTRAVENOUS

## 2024-01-30 MED ORDER — LIDOCAINE HCL (PF) 2 % IJ SOLN
INTRAMUSCULAR | Status: AC
Start: 2024-01-30 — End: ?
  Filled 2024-01-30: qty 5

## 2024-01-30 MED ORDER — HYDROMORPHONE HCL 1 MG/ML IJ SOLN
0.5000 mg | INTRAMUSCULAR | Status: DC | PRN
  Administered 2024-01-30 – 2024-01-31 (×4): 1 mg via INTRAVENOUS
  Filled 2024-01-30 (×4): qty 1

## 2024-01-30 MED ORDER — CYCLOBENZAPRINE HCL 10 MG PO TABS
10.0000 mg | ORAL_TABLET | Freq: Every day | ORAL | Status: DC
  Administered 2024-01-30: 10 mg via ORAL
  Filled 2024-01-30: qty 1

## 2024-01-30 SURGICAL SUPPLY — 26 items
BAG COUNTER SPONGE SURGICOUNT (BAG) IMPLANT
BAG URINE DRAIN 2000ML AR STRL (UROLOGICAL SUPPLIES) ×2 IMPLANT
BAG URO CATCHER STRL LF (MISCELLANEOUS) ×2 IMPLANT
CATH URETL OPEN END 6FR 70 (CATHETERS) IMPLANT
CATH URTH STD 24FR FL 3W 2 (CATHETERS) IMPLANT
CLOTH BEACON ORANGE TIMEOUT ST (SAFETY) ×2 IMPLANT
DRAPE FOOT SWITCH (DRAPES) ×2 IMPLANT
ELECT REM PT RETURN 15FT ADLT (MISCELLANEOUS) IMPLANT
GLOVE SURG LX STRL 7.5 STRW (GLOVE) ×2 IMPLANT
GOWN STRL REUS W/ TWL XL LVL3 (GOWN DISPOSABLE) ×2 IMPLANT
GUIDEWIRE STR DUAL SENSOR (WIRE) ×2 IMPLANT
HOLDER FOLEY CATH W/STRAP (MISCELLANEOUS) IMPLANT
IV CATH AUTO 14GX1.75 SAFE ORG (IV SOLUTION) IMPLANT
KIT TURNOVER KIT A (KITS) IMPLANT
LOOP CUT BIPOLAR 24F LRG (ELECTROSURGICAL) IMPLANT
MANIFOLD NEPTUNE II (INSTRUMENTS) ×2 IMPLANT
NS IRRIG 1000ML POUR BTL (IV SOLUTION) IMPLANT
PACK CYSTO (CUSTOM PROCEDURE TRAY) ×2 IMPLANT
PAD PREP 24X48 CUFFED NSTRL (MISCELLANEOUS) ×2 IMPLANT
STENT URET 6FRX22 CONTOUR (STENTS) IMPLANT
SYR 30ML LL (SYRINGE) ×2 IMPLANT
SYR TOOMEY IRRIG 70ML (MISCELLANEOUS) ×2 IMPLANT
SYRINGE TOOMEY IRRIG 70ML (MISCELLANEOUS) ×2 IMPLANT
TUBING CONNECTING 10 (TUBING) ×2 IMPLANT
TUBING UROLOGY SET (TUBING) ×2 IMPLANT
WATER STERILE IRR 500ML POUR (IV SOLUTION) IMPLANT

## 2024-01-30 NOTE — Op Note (Signed)
 NAMEBENFORD, Adam Mcguire MEDICAL RECORD NO: [id number] ACCOUNT NO: [id number] DATE OF BIRTH: 1965/12/30 FACILITY: [facility] LOCATION: [id number] PHYSICIAN: Sebastian Ache, MD  Operative Report   DATE OF PROCEDURE: 01/30/2024  PREOPERATIVE DIAGNOSES: 1.  Locally advanced prostate cancer. 2.  Malignant hydronephrosis.  POSTOPERATIVE DIAGNOSES: 1.  Locally advanced prostate cancer. 2.  Malignant hydronephrosis.  PROCEDURE PERFORMED: 1.  Cystoscopy with bilateral retrograde pyelogram interpretation. 2. Insertion of bilateral ureteral stents. 3.  Transurethral resection of the prostate.  ESTIMATED BLOOD LOSS:  50 mL.  COMPLICATION:  None.  SPECIMENS:  Prostate chips for permanent pathology, history of prostate cancer.  FINDINGS:  Moderate bilateral malignant ureteral obstruction, mostly from mass effect.  No direct invasion of ureteral orifices.  Wide open prostate fossa from the verumontanum to the bladder neck following transurethral resection.  DRAINS:  24-French 3-way Foley catheter to normal saline irrigation.  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  He has bilateral hydronephrosis to the level of the  bladder with some very high concern of wound infiltration of his ureteral orifices and a creatinine of 2, but normal volume status.  He also has been  intermittent hematuria.  He has been placed on systemic therapy with androgen deprivation.  Further  options were discussed for management of his lower tract and obstruction, including recommended path of transurethral resection to optimize bladder emptying and possible stenting.  He presents for this today.  Informed consent was obtained and placed in  medical record.  DESCRIPTION OF PROCEDURE:  The patient identified and verified.  Procedure being cystoscopy, bilateral retrograde and  possible stenting and transurethral resection of the prostate was confirmed.  Procedure time out was performed.  Intravenous antibiotics  administered.  General LMA anesthesia was introduced.  The patient was placed into a low lithotomy position.  Sterile field was created, prepped and draped the patient's penis, perineum, and proximal thighs using iodine.  Cystourethroscopy was performed  using a 21-French with rigid cystoscope with offset lens.  There anterior posterior urethra did reveal some nodularity within the intraluminal prostate, some mild bilobar hypertrophy.  The Inspection of the urinary bladder revealed no diverticula,  calcifications, or papillary lesions.  Amazingly, the orifices were visibly patent.  There was no obvious direct invasion of the ureteral orifices.  This was quite favorable.  The right ureteral orifice was cannulated with a 6-French end-hold catheter  and the right retrograde pyelogram was obtained.  Right retrograde pyelogram demonstrated single right ureter and single system right kidney.  There was significant J-hooking of the ureter and hydronephrosis.  The distal ureter appeared to be more mass effect and not so much intraluminal obstruction,  but given his marginal renal function, it was felt that the stenting was warranted and a new 6 x 22 cm contour type stent was carefully placed using fluoroscopic guidance, good proximal and distal plane were noted.  Next, left retrograde pyelogram was  obtained.  Left retrograde pyelogram demonstrated a single left ureter and left system left kidney.  There was again hydronephrosis noted in the distal ureter consistent with more mass effect.  Next, a 6 x 22 cm contour stent was placed over the left sensor working wire at the level of kidney, distal  bladder.  There was copious efflux of urine around and to the distal end of the stents.  The urethra was then calibrated from  20-French to  30-French using metal sounds, and then the  resectoscope sheath was entered with a visual obturator and using the resectoscope loop very carefully transurethral resection was performed in a top-down fashion from the area of the bladder neck to the  verumontanum down to the superficial fibromuscular stroma of the prostate capsule.  Exquisite care was taken to avoid undermining of the bladder neck, which did not occur.  This generated many prostate chips, which were then irrigated and set aside for  permanent pathology.  The base of the fossa was fulgurated in a descending spiral fashion, which resulted in excellent hemostasis.  A sensory wire was placed below the urinary bladder over which a 24-French 3-way Foley catheter was carefully placed using  cylinder technique to maximally prevent bladder neck trauma.  30 mL of water in balloon.  This was connected to normal saline irrigation.  Efflux was very light pink and the procedure was then terminated.  The patient tolerated the procedure well.  No immediate  periprocedural complications.  The patient taken to postanesthesia care unit in stable condition.  Plan for observation admission.   PUS D: 01/30/2024 11:52:37 am T: 01/30/2024 6:48:00 pm  JOB: [id number]/ [id number]

## 2024-01-30 NOTE — Brief Op Note (Signed)
 01/30/2024  11:47 AM  PATIENT:  Adam Mcguire  58 y.o. male  PRE-OPERATIVE DIAGNOSIS:  PROSTATE CANCER, HYDRONEPHROSIS  POST-OPERATIVE DIAGNOSIS:  PROSTATE CANCER, HYDRONEPHROSIS  PROCEDURE:  Procedure(s): TURP (TRANSURETHRAL RESECTION OF PROSTATE) (N/A) CYSTOSCOPY, WITH RETROGRADE PYELOGRAM (Bilateral)  SURGEON:  Surgeons and Role:    * Manny, Delbert Phenix., MD - Primary  PHYSICIAN ASSISTANT:   ASSISTANTS: none   ANESTHESIA:   general  EBL:  minimal   BLOOD ADMINISTERED:none  DRAINS:  26F foley to 3 way irrigation    LOCAL MEDICATIONS USED:  NONE  SPECIMEN:  Source of Specimen:  prostate chips  DISPOSITION OF SPECIMEN:  PATHOLOGY  COUNTS:  YES  TOURNIQUET:  * No tourniquets in log *  DICTATION: .Other Dictation: Dictation Number [id number]  PLAN OF CARE: Admit for overnight observation  PATIENT DISPOSITION:  PACU - hemodynamically stable.   Delay start of Pharmacological VTE agent (>24hrs) due to surgical blood loss or risk of bleeding: yes

## 2024-01-30 NOTE — Transfer of Care (Signed)
 Immediate Anesthesia Transfer of Care Note  Patient: Adam Mcguire  Procedure(s) Performed: TURP (TRANSURETHRAL RESECTION OF PROSTATE) CYSTOSCOPY, WITH RETROGRADE PYELOGRAM WITH BILATERAL STENT PLACEMENT (Bilateral)  Patient Location: PACU  Anesthesia Type:General  Level of Consciousness: awake, alert , and oriented  Airway & Oxygen Therapy: Patient connected to face mask oxygen  Post-op Assessment: Report given to RN and Post -op Vital signs reviewed and stable  Post vital signs: Reviewed and stable  Last Vitals:  Vitals Value Taken Time  BP 170/98 01/30/24 1152  Temp    Pulse 79 01/30/24 1155  Resp 13 01/30/24 1155  SpO2 99 % 01/30/24 1155  Vitals shown include unfiled device data.  Last Pain:  Vitals:   01/30/24 0929  TempSrc:   PainSc: 0-No pain         Complications: There were no known notable events for this encounter.

## 2024-01-30 NOTE — Anesthesia Preprocedure Evaluation (Addendum)
 Anesthesia Evaluation  Patient identified by MRN, date of birth, ID band Patient awake    Reviewed: Allergy & Precautions, NPO status , Patient's Chart, lab work & pertinent test results  History of Anesthesia Complications Negative for: history of anesthetic complications  Airway Mallampati: II  TM Distance: >3 FB Neck ROM: Full    Dental  (+) Missing,    Pulmonary Current Smoker   Pulmonary exam normal        Cardiovascular hypertension, Pt. on medications Normal cardiovascular exam     Neuro/Psych negative neurological ROS     GI/Hepatic negative GI ROS, Neg liver ROS,,,  Endo/Other  negative endocrine ROS    Renal/GU ARFRenal disease (Cr 2.18)     Musculoskeletal negative musculoskeletal ROS (+)    Abdominal   Peds  Hematology  (+) Blood dyscrasia, anemia   Anesthesia Other Findings Prostate ca  Reproductive/Obstetrics                              Anesthesia Physical Anesthesia Plan  ASA: 2  Anesthesia Plan: General   Post-op Pain Management: Tylenol PO (pre-op)*   Induction: Intravenous  PONV Risk Score and Plan: 2 and Treatment may vary due to age or medical condition, Ondansetron, Dexamethasone and Midazolam  Airway Management Planned: LMA  Additional Equipment: None  Intra-op Plan:   Post-operative Plan: Extubation in OR  Informed Consent: I have reviewed the patients History and Physical, chart, labs and discussed the procedure including the risks, benefits and alternatives for the proposed anesthesia with the patient or authorized representative who has indicated his/her understanding and acceptance.     Dental advisory given  Plan Discussed with: CRNA  Anesthesia Plan Comments:         Anesthesia Quick Evaluation

## 2024-01-30 NOTE — Anesthesia Postprocedure Evaluation (Signed)
 Anesthesia Post Note  Patient: Adam Mcguire  Procedure(s) Performed: TURP (TRANSURETHRAL RESECTION OF PROSTATE) CYSTOSCOPY, WITH RETROGRADE PYELOGRAM WITH BILATERAL STENT PLACEMENT (Bilateral)     Patient location during evaluation: PACU Anesthesia Type: General Level of consciousness: awake and alert Pain management: pain level controlled Vital Signs Assessment: post-procedure vital signs reviewed and stable Respiratory status: spontaneous breathing, nonlabored ventilation and respiratory function stable Cardiovascular status: blood pressure returned to baseline Postop Assessment: no apparent nausea or vomiting Anesthetic complications: no   There were no known notable events for this encounter.  Last Vitals:  Vitals:   01/30/24 1245 01/30/24 1247  BP:  (!) 165/94  Pulse: 70 63  Resp: 15 11  Temp:    SpO2: 100% 99%                  Shanda Howells

## 2024-01-30 NOTE — H&P (Signed)
 Adam Mcguire is an 58 y.o. male.    Chief Complaint: Pre-OpTURP, Retrogrades  HPI:   1 - Very Elevated PSA, Probably Metastatic Prostate Cancer - 12/12 cores up to 95% grade 5 cancer on BX 12/2203 on eval PSA 213. Brother with prostate cancer. DRE 09/2023 with Rt side nodular and fixed. 11/2023 TRUS BX 51 mL, tiny median, very fixed. CT with shoddy <2cm adenopathy from aortoa to pelvis. Initial plan was Orgovyx start but he has not received. Firmagon 240mg  delivered mid 01/2024.  2- Gross Hematuria - New gross hematuira since 08/2023. 40PY smoker. STI screen negative at PCP. Very elevated PSA as per above. CT 11/2023 with bilat hydro to bladder, no upper tract masses. cysto pending.   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 Ct 11/2023 with bilateral hydro to bladder neck / prostate.   4 - Lower Urinary Tract Symptoms / Incomplete Bladder Emptying - progressive bother from irritative and obstructive LUTS. PVR 2024. DRE 40gm. Starting tamsulosin. TURP pending for 01/30/24.   PMH sig for open appy. 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 HIs PCP is Adam Kells NP with [facility]   Today "Adam Mcguire" is seen to proceed with TURP / Retrogrades to improve bladdery emptying and hopefully unroof / reduce malignant obstruction. No interval fevers. Cr 2.2, Hgb 12.   Past Medical History:  Diagnosis Date   History of kidney stones    Hypertension    Kidney stone 11/07/2003    Past Surgical History:  Procedure Laterality Date   APPENDECTOMY     VASECTOMY      Family History  Problem Relation Age of Onset   CAD Father    Colon cancer Brother    Social History:  reports that he has been smoking cigarettes. He has a 15 pack-year smoking history. He has never used smokeless tobacco. He reports that he does not drink alcohol and does  not use drugs.  Allergies:  Allergies  Allergen Reactions   Vicodin [Hydrocodone-Acetaminophen] Nausea And Vomiting    Patient can tolerate acetaminiphen    No medications prior to admission.    No results found for this or any previous visit (from the past 48 hours). No results found.  Review of Systems  Constitutional:  Negative for chills and fatigue.  Genitourinary:  Positive for hematuria.  All other systems reviewed and are negative.   There were no vitals taken for this visit. Physical Exam Vitals reviewed.  Constitutional:      Comments: Pleasant, at baseline.   HENT:     Head: Normocephalic.  Eyes:     Pupils: Pupils are equal, round, and reactive to light.  Cardiovascular:     Rate and Rhythm: Normal rate.  Pulmonary:     Effort: Pulmonary effort is normal.  Abdominal:     General: Abdomen is flat.  Genitourinary:    Comments: No CVAT Musculoskeletal:        General: Normal range of motion.     Cervical back: Normal range of motion.  Skin:    General: Skin is warm.  Neurological:     General: No focal deficit present.     Mental Status: He is alert.  Psychiatric:        Mood and Affect: Mood normal.      Assessment/Plan  Proceed as planned with  TURP / Retrogrades / Possible Stenting. Risks, benefits, alternatives, expected peri-op course discussed previously and reiterate today.   Loletta Parish., MD 01/30/2024, 7:20 AM

## 2024-01-30 NOTE — Anesthesia Procedure Notes (Signed)
 Procedure Name: LMA Insertion Date/Time: 01/30/2024 10:40 AM  Performed by: Sharyn Dross, CRNAPre-anesthesia Checklist: Patient identified, Emergency Drugs available, Suction available and Patient being monitored Patient Re-evaluated:Patient Re-evaluated prior to induction Oxygen Delivery Method: Circle system utilized Preoxygenation: Pre-oxygenation with 100% oxygen Induction Type: IV induction Ventilation: Mask ventilation without difficulty LMA: LMA flexible inserted LMA Size: 4.0 Number of attempts: 1 Placement Confirmation: positive ETCO2 and breath sounds checked- equal and bilateral Tube secured with: Tape Dental Injury: Teeth and Oropharynx as per pre-operative assessment

## 2024-01-31 ENCOUNTER — Encounter (HOSPITAL_COMMUNITY): Payer: Self-pay | Admitting: Urology

## 2024-01-31 DIAGNOSIS — C61 Malignant neoplasm of prostate: Secondary | ICD-10-CM | POA: Diagnosis not present

## 2024-01-31 LAB — BASIC METABOLIC PANEL WITH GFR
Anion gap: 9 (ref 5–15)
BUN: 28 mg/dL — ABNORMAL HIGH (ref 6–20)
CO2: 23 mmol/L (ref 22–32)
Calcium: 8.7 mg/dL — ABNORMAL LOW (ref 8.9–10.3)
Chloride: 103 mmol/L (ref 98–111)
Creatinine, Ser: 2.02 mg/dL — ABNORMAL HIGH (ref 0.61–1.24)
GFR, Estimated: 38 mL/min — ABNORMAL LOW (ref >60)
Glucose, Bld: 130 mg/dL — ABNORMAL HIGH (ref 70–99)
Potassium: 5.5 mmol/L — ABNORMAL HIGH (ref 3.5–5.1)
Sodium: 135 mmol/L (ref 135–145)

## 2024-01-31 LAB — SURGICAL PATHOLOGY

## 2024-01-31 LAB — HEMOGLOBIN AND HEMATOCRIT, BLOOD
HCT: 38.2 % — ABNORMAL LOW (ref 39.0–52.0)
Hemoglobin: 11.9 g/dL — ABNORMAL LOW (ref 13.0–17.0)

## 2024-01-31 MED ORDER — SENNOSIDES-DOCUSATE SODIUM 8.6-50 MG PO TABS: 1.0000 | ORAL_TABLET | Freq: Two times a day (BID) | ORAL | 0 refills | Status: DC

## 2024-01-31 MED ORDER — CEPHALEXIN 500 MG PO CAPS: 500.0000 mg | ORAL_CAPSULE | Freq: Two times a day (BID) | ORAL | 0 refills | Status: DC

## 2024-01-31 MED ORDER — OXYCODONE-ACETAMINOPHEN 5-325 MG PO TABS
1.0000 | ORAL_TABLET | ORAL | 0 refills | Status: AC | PRN
Start: 2024-01-31 — End: 2025-01-30

## 2024-01-31 NOTE — Progress Notes (Signed)
   01/31/24 0959  TOC Brief Assessment  Insurance and Status Reviewed  Patient has primary care physician No (Patient is working on finding a PCP and daughter declined TOC assistance.)  Home environment has been reviewed Resides in single family home  Prior level of function: Independent with ADLs at baseline  Prior/Current Home Services No current home services  Social Drivers of Health Review SDOH reviewed no interventions necessary  Readmission risk has been reviewed Yes  Transition of care needs no transition of care needs at this time

## 2024-01-31 NOTE — Discharge Summary (Signed)
 Physician Discharge Summary  Patient ID: Adam Mcguire MRN: [id number] DOB/AGE: [birth year] 58 y.o.  Admit date: 01/30/2024 Discharge date: 01/31/2024  Admission Diagnoses: Prostate CAncer with Malignant Obstruction  Discharge Diagnoses:  Principal Problem:   Prostate cancer [facility])   Discharged Condition: fair  Hospital Course:   Pt underwent transurethral resection of prostate and bilateral ureteral stent placement on 01/30/24, the day of admission, without acute complication. Observed on 4th floor Urology service post-op. By the ealry afternoon of 3/27, the day of discharge, he is ambulatory, tollerating PO diet, pain controlled on PO meds, and felt to be adequate for discharge. Path pending, Cr 2.02 (improving), Hgb 11.9 at discharge. He will go home with catheter.     Consults: None  Significant Diagnostic Studies: labs: as per above  Treatments: surgery: as per above  Discharge Exam: Blood pressure 137/84, pulse 62, temperature 98.4 F (36.9 C), temperature source Oral, resp. rate 18, height 5\' 7"  (1.702 m), weight 86.2 kg, SpO2 99%.  NAD, AOx3 Non-labored breathing on RA RRR SNTND, stable mild obesity Foley c/d/I with medium yellow urine off irrigation NO c/c/e    Disposition: HOME There are no questions and answers to display.         Allergies as of 01/31/2024       Reactions   Vicodin [hydrocodone-acetaminophen] Nausea And Vomiting   Patient can tolerate acetaminiphen        Medication List     PAUSE taking these medications    Xtandi 80 MG tablet Wait to take this until your doctor or other care provider tells you to start again. Generic drug: enzalutamide Take 80 mg by mouth daily.       TAKE these medications    amLODipine 5 MG tablet Commonly known as: NORVASC Take 1 tablet (5 mg total) by mouth daily.   cephALEXin 500 MG capsule Commonly known as: KEFLEX Take 1 capsule (500 mg total) by mouth 2 (two) times daily. X 5 days to prevent  post-op infection.   cyclobenzaprine 10 MG tablet Commonly known as: FLEXERIL Take 10 mg by mouth at bedtime.   levofloxacin 750 MG tablet Commonly known as: LEVAQUIN Take 750 mg by mouth daily.   Orgovyx 120 MG tablet Generic drug: relugolix Take 120 mg by mouth daily.   oxyCODONE-acetaminophen 5-325 MG tablet Commonly known as: Percocet Take 1 tablet by mouth every 4 (four) hours as needed for severe pain (pain score 7-10) or moderate pain (pain score 4-6) (post-operatively).   senna-docusate 8.6-50 MG tablet Commonly known as: Senokot-S Take 1 tablet by mouth 2 (two) times daily. While taking strong pain meds to prevent constipation.        Follow-up Information     Berneice Heinrich Delbert Phenix., MD Follow up on 02/04/2024.   Specialty: Urology Why: at 10:15 for MD visit, pathology review, and catheter removal. Contact information: [address] [place] [address] [phone]                 Signed: Loletta Parish. 01/31/2024, 11:15 AM

## 2024-01-31 NOTE — Discharge Instructions (Signed)
1 - You may have urinary urgency (bladder spasms) and bloody urine on / off with stents in place. This is normal.  2 - Call MD or go to ER for fever >102, severe pain / nausea / vomiting not relieved by medications, or acute change in medical status.  

## 2024-01-31 NOTE — Plan of Care (Signed)

## 2024-02-12 ENCOUNTER — Encounter: Payer: Self-pay | Admitting: Family Medicine

## 2024-02-12 ENCOUNTER — Ambulatory Visit: Admitting: Family Medicine

## 2024-02-12 VITALS — BP 142/94 | HR 72 | Temp 98.0°F | Ht 67.0 in | Wt 182.6 lb

## 2024-02-12 DIAGNOSIS — I10 Essential (primary) hypertension: Secondary | ICD-10-CM

## 2024-02-12 DIAGNOSIS — C61 Malignant neoplasm of prostate: Secondary | ICD-10-CM | POA: Diagnosis not present

## 2024-02-12 DIAGNOSIS — R319 Hematuria, unspecified: Secondary | ICD-10-CM

## 2024-02-12 DIAGNOSIS — E559 Vitamin D deficiency, unspecified: Secondary | ICD-10-CM

## 2024-02-12 DIAGNOSIS — E663 Overweight: Secondary | ICD-10-CM | POA: Diagnosis not present

## 2024-02-12 DIAGNOSIS — N1330 Unspecified hydronephrosis: Secondary | ICD-10-CM | POA: Diagnosis not present

## 2024-02-12 DIAGNOSIS — Z1159 Encounter for screening for other viral diseases: Secondary | ICD-10-CM

## 2024-02-12 DIAGNOSIS — Z131 Encounter for screening for diabetes mellitus: Secondary | ICD-10-CM | POA: Diagnosis not present

## 2024-02-12 DIAGNOSIS — Z1211 Encounter for screening for malignant neoplasm of colon: Secondary | ICD-10-CM

## 2024-02-12 MED ORDER — AMLODIPINE BESYLATE 10 MG PO TABS: 10.0000 mg | ORAL_TABLET | Freq: Every day | ORAL | 3 refills | Status: AC

## 2024-02-12 NOTE — Patient Instructions (Addendum)
 [facility] contact Please call to schedule visit and/or procedure IF you do not hear within a week Address: [address] [place], [place], [place] [address] Phone: [phone]   Sign release of information at the check out desk for office notes from Dr. Berneice Heinrich with [facility]  also Sign release of information at the check out desk for all visits and labs for last 3 years from [facility]  Please stop by lab before you go If you have mychart- we will send your results within 3 business days of [place] receiving them.  If you do not have mychart- we will call you about results within 5 business days of [place] receiving them.  *please also note that you will see labs on mychart as soon as they post. I will later go in and write notes on them- will say "notes from Dr. Durene Cal"   Blood pressure above goal- can take 2 of the 5 mg amlodipine until you run out then pick up the 10 mg dose and take daily- see me back in a month and bring log of blood pressure - our goal is at least <135/85   Recommended follow up: Return in about 1 month (around 03/13/2024) for followup or sooner if needed.Schedule b4 you leave.

## 2024-02-12 NOTE — Progress Notes (Signed)
 Phone: [phone]   Subjective:  Patient presents today to establish care.  Prior patient of [facility] but thye transitioned to urgent care and has nto had recent consistent primary care doctor.  Chief Complaint  Patient presents with   Hypertension    Not fasting. Denies issues/ROS. Does NOT do any vaccines.   See problem oriented charting  The following were reviewed and entered/updated in epic: Past Medical History:  Diagnosis Date   History of kidney stones    Hypertension    Kidney stone 11/07/2003   Patient Active Problem List   Diagnosis Date Noted   Prostate cancer (HCC) 01/30/2024    Priority: High   Vitamin D deficiency 01/24/2024    Priority: Medium    Mixed hyperlipidemia 01/24/2024    Priority: Medium    Aortic atherosclerosis (HCC) 01/24/2024    Priority: Medium    Bilateral hydronephrosis 01/24/2024    Priority: Low   Hematuria 01/24/2024    Priority: Low   Flank pain with history of urolithiasis 01/24/2024    Priority: Low   Kidney stone 12/06/2011    Priority: Low   Past Surgical History:  Procedure Laterality Date   APPENDECTOMY     CYSTOSCOPY W/ RETROGRADES Bilateral 01/30/2024   Procedure: CYSTOSCOPY, WITH RETROGRADE PYELOGRAM WITH BILATERAL STENT PLACEMENT;  Surgeon: Loletta Parish., MD;  Location: [facility];  Service: Urology;  Laterality: Bilateral;   TIBIA FRACTURE SURGERY     pulled 16 sheets of sheet rock- crushed right ankle and left ankle and knee cap on left   TRANSURETHRAL RESECTION OF PROSTATE N/A 01/30/2024   Procedure: TURP (TRANSURETHRAL RESECTION OF PROSTATE);  Surgeon: Loletta Parish., MD;  Location: [facility];  Service: Urology;  Laterality: N/A;   VASECTOMY      Family History  Problem Relation Age of Onset   Healthy Mother        as far as hes aware   CAD Father        age 69   Healthy Sister        healthy as far as hes aware in 49s in [place]   Colon cancer Brother    Hypertension Brother      Medications- reviewed and updated Current Outpatient Medications  Medication Sig Dispense Refill   ORGOVYX 120 MG tablet Take 120 mg by mouth daily.     [Paused] XTANDI 80 MG tablet Take 80 mg by mouth daily.     amLODipine (NORVASC) 10 MG tablet Take 1 tablet (10 mg total) by mouth daily. 90 tablet 3   oxyCODONE-acetaminophen (PERCOCET) 5-325 MG tablet Take 1 tablet by mouth every 4 (four) hours as needed for severe pain (pain score 7-10) or moderate pain (pain score 4-6) (post-operatively). (Patient not taking: Reported on 02/12/2024) 15 tablet 0   No current facility-administered medications for this visit.    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 Oropharynx normal. TM normal. Eyes: sclera and lids normal, PERRLA Neck: no thyromegaly, no cervical lymphadenopathy CV: RRR no murmurs rubs or gallops Lungs: CTAB no crackles, wheeze, rhonchi Abdomen: soft/nontender/nondistended/normal bowel sounds. No rebound or guarding.  Ext: no edema Skin: warm, dry Neuro: 5/5 strength in upper and lower extremities, normal gait, normal reflexes    Assessment and Plan:   #Prostate cancer- detected age 35- had laser surgery, then TURP, and on androgen deprivation. Reports not outside of prostate   -did have some acute kidney injury with hydronephrosis (reports was not told decreased kidney function- last normal in 2021 on our chart)- want to make sure that's trending down- check today  #hypertension S: medication: amlodipine 5 mg - pretty consistent other than missed dose today Home readings #s: highest at home about a week ago 161- also 140's and 150's- has a copy BP Readings from Last 3 Encounters:  02/12/24 (!) 142/94  01/31/24 (!) 147/76  01/29/24 (!) 157/98  A/P: Blood pressure above goal- can take 2 of the 5 mg amlodipine until you run out then pick up the 10 mg dose and take daily- see me back in a month and bring log of blood pressure - our goal is at least <135/85   #hyperlipidemia- noted by the [employer] [place]  # aortic atherosclerosis  S: Medication:none- reports was up but trended back down  A/P: no recnet lipids on file here- check with labs - LDL goal 70 or less due to aortic atherosclerosis - update today  #Vitamin D deficiency- reports years ago was low S: Medication: none A/P: reports history of low vitamin D_ we will check labs to evaluate further as not currently on treatment   #smoker- half ppd for 40 years- doesn't qualify for lung cancer screening due to prostate cancer- plans to try to continue to cut back further- has already cut down with time.    Recommended follow up: Return in about 1 month (around 03/13/2024) for followup or sooner if needed.Schedule b4 you leave.  Meds ordered this encounter  Medications   amLODipine (NORVASC) 10 MG tablet    Sig: Take 1 tablet (10 mg total) by mouth daily.    Dispense:  90 tablet    Refill:  3    Return precautions advised. Tana Conch, MD

## 2024-02-13 ENCOUNTER — Other Ambulatory Visit: Payer: Self-pay | Admitting: Family Medicine

## 2024-02-13 LAB — CBC WITH DIFFERENTIAL/PLATELET
Basophils Absolute: 0.1 10*3/uL (ref 0.0–0.1)
Basophils Relative: 1.3 % (ref 0.0–3.0)
Eosinophils Absolute: 0.5 10*3/uL (ref 0.0–0.7)
Eosinophils Relative: 5.2 % — ABNORMAL HIGH (ref 0.0–5.0)
HCT: 40.9 % (ref 39.0–52.0)
Hemoglobin: 13.4 g/dL (ref 13.0–17.0)
Lymphocytes Relative: 31.6 % (ref 12.0–46.0)
Lymphs Abs: 3 10*3/uL (ref 0.7–4.0)
MCHC: 32.8 g/dL (ref 30.0–36.0)
MCV: 81.1 fl (ref 78.0–100.0)
Monocytes Absolute: 0.5 10*3/uL (ref 0.1–1.0)
Monocytes Relative: 5.5 % (ref 3.0–12.0)
Neutro Abs: 5.3 10*3/uL (ref 1.4–7.7)
Neutrophils Relative %: 56.4 % (ref 43.0–77.0)
Platelets: 289 10*3/uL (ref 150.0–400.0)
RBC: 5.04 Mil/uL (ref 4.22–5.81)
RDW: 13.6 % (ref 11.5–15.5)
WBC: 9.5 10*3/uL (ref 4.0–10.5)

## 2024-02-13 LAB — COMPREHENSIVE METABOLIC PANEL WITH GFR
ALT: 48 U/L (ref 0–53)
AST: 22 U/L (ref 0–37)
Albumin: 4.4 g/dL (ref 3.5–5.2)
Alkaline Phosphatase: 69 U/L (ref 39–117)
BUN: 22 mg/dL (ref 6–23)
CO2: 28 meq/L (ref 19–32)
Calcium: 9.4 mg/dL (ref 8.4–10.5)
Chloride: 102 meq/L (ref 96–112)
Creatinine, Ser: 1.69 mg/dL — ABNORMAL HIGH (ref 0.40–1.50)
GFR: 44.79 mL/min — ABNORMAL LOW (ref >60.00)
Glucose, Bld: 75 mg/dL (ref 70–99)
Potassium: 3.9 meq/L (ref 3.5–5.1)
Sodium: 139 meq/L (ref 135–145)
Total Bilirubin: 0.3 mg/dL (ref 0.2–1.2)
Total Protein: 7.8 g/dL (ref 6.0–8.3)

## 2024-02-13 LAB — LIPID PANEL
Cholesterol: 180 mg/dL (ref 0–200)
HDL: 29 mg/dL — ABNORMAL LOW (ref >39.00)
NonHDL: 151.47
Total CHOL/HDL Ratio: 6
Triglycerides: 462 mg/dL — ABNORMAL HIGH (ref 0.0–149.0)
VLDL: 92.4 mg/dL — ABNORMAL HIGH (ref 0.0–40.0)

## 2024-02-13 LAB — HEPATITIS C ANTIBODY: Hepatitis C Ab: NONREACTIVE

## 2024-02-13 LAB — VITAMIN D 25 HYDROXY (VIT D DEFICIENCY, FRACTURES): VITD: 18.73 ng/mL — ABNORMAL LOW (ref 30.00–100.00)

## 2024-02-13 LAB — HEMOGLOBIN A1C: Hgb A1c MFr Bld: 7.3 % — ABNORMAL HIGH (ref 4.6–6.5)

## 2024-02-13 LAB — LDL CHOLESTEROL, DIRECT: Direct LDL: 81 mg/dL

## 2024-02-13 MED ORDER — VITAMIN D (ERGOCALCIFEROL) 1.25 MG (50000 UNIT) PO CAPS
50000.0000 [IU] | ORAL_CAPSULE | ORAL | 1 refills | Status: AC
Start: 2024-02-13 — End: ?

## 2024-02-15 ENCOUNTER — Other Ambulatory Visit: Payer: Self-pay

## 2024-02-15 DIAGNOSIS — E119 Type 2 diabetes mellitus without complications: Secondary | ICD-10-CM

## 2024-03-04 ENCOUNTER — Encounter: Payer: Self-pay | Admitting: Family Medicine

## 2024-03-18 ENCOUNTER — Ambulatory Visit: Admitting: Family Medicine

## 2024-03-19 ENCOUNTER — Encounter: Payer: Self-pay | Admitting: Family Medicine

## 2024-09-23 ENCOUNTER — Other Ambulatory Visit: Payer: Self-pay | Admitting: Urology

## 2024-10-21 NOTE — Progress Notes (Signed)
 COVID Vaccine received:  [x]  No []  Yes Date of any COVID positive Test in last 90 days: no PCP - Garnette Lukes MD Cardiologist - n/a  Chest x-ray -  EKG -  01/25/24 epic Stress Test -  ECHO -  Cardiac Cath -   Bowel Prep - [x]  No  []   Yes ______  Pacemaker / ICD device [x]  No []  Yes   Spinal Cord Stimulator:[x]  No []  Yes       History of Sleep Apnea? [x]  No []  Yes   CPAP used?- [x]  No []  Yes    Does the patient monitor blood sugar?          [x]  No []  Yes  []  N/A  Patient has: []  NO Hx DM   []  Pre-DM                 []  DM1  [x]   DM2 Does patient have a [employer] or Dexacom? [x]  No []  Yes   Fasting Blood Sugar Ranges-  Checks Blood Sugar _____ times a day  GLP1 agonist / usual dose - no GLP1 instructions:  SGLT-2 inhibitors / usual dose - no SGLT-2 instructions:   Blood Thinner / Instructions:no Aspirin Instructions:no  Comments:   Activity level: Patient is able to climb a flight of stairs without difficulty; [x]  No CP  [x]  No SOB,  Patient can perform ADLs without assistance.   Anesthesia review:   Patient denies shortness of breath, fever, cough and chest pain at PAT appointment.  Patient verbalized understanding and agreement to the Pre-Surgical Instructions that were given to them at this PAT appointment. Patient was also educated of the need to review these PAT instructions again prior to his/her surgery.I reviewed the appropriate phone numbers to call if they have any and questions or concerns.

## 2024-10-21 NOTE — Patient Instructions (Signed)
 SURGICAL WAITING ROOM VISITATION  Patients having surgery or a procedure may have no more than 2 support people in the waiting area - these visitors may rotate.    Children ages 84 and under will not be able to visit patients in [facility] under most circumstances.   Visitors with respiratory illnesses are discouraged from visiting and should remain at home.  If the patient needs to stay at the hospital during part of their recovery, the visitor guidelines for inpatient rooms apply. Pre-op nurse will coordinate an appropriate time for 1 support person to accompany patient in pre-op.  This support person may not rotate.    Please refer to the [facility] website for the visitor guidelines for Inpatients (after your surgery is over and you are in a regular room).       Your procedure is scheduled on: 10/24/24   Report to [facility] Main Entrance    Report to admitting at 5:15 AM   Call this number if you have problems the morning of surgery [phone]   Do not eat food or drink liquids:After Midnight.but may have sips of water to take meds.     Oral Hygiene is also important to reduce your risk of infection.                                    Remember - BRUSH YOUR TEETH THE MORNING OF SURGERY WITH YOUR REGULAR TOOTHPASTE   Do NOT smoke after Midnight   Stop all vitamins and herbal supplements 7 days before surgery.   Take these medicines the morning of surgery with A SIP OF WATER: amlodipine , orgovyx             You may not have any metal on your body including hair pins, jewelry, and body piercing             Do not wear make-up, lotions, powders, perfumes/cologne, or deodorant              Men may shave face and neck.   Do not bring valuables to the hospital. [facility] IS NOT             RESPONSIBLE   FOR VALUABLES.   Contacts, glasses, dentures or bridgework may not be worn into surgery.   DO NOT BRING YOUR HOME MEDICATIONS TO THE  HOSPITAL. PHARMACY WILL DISPENSE MEDICATIONS LISTED ON YOUR MEDICATION LIST TO YOU DURING YOUR ADMISSION IN THE HOSPITAL!    Patients discharged on the day of surgery will not be allowed to drive home.  Someone NEEDS to stay with you for the first 24 hours after anesthesia.   Special Instructions: Bring a copy of your healthcare power of attorney and living will documents the day of surgery if you haven't scanned them before.              Please read over the following fact sheets you were given: IF YOU HAVE QUESTIONS ABOUT YOUR PRE-OP INSTRUCTIONS PLEASE CALL [phone] Adam Mcguire   If you received a COVID test during your pre-op visit  it is requested that you wear a mask when out in public, stay away from anyone that may not be feeling well and notify your surgeon if you develop symptoms. If you test positive for Covid or have been in contact with anyone that has tested positive in the last 10 days please notify you surgeon.  [facility] - Preparing for Surgery Before surgery, you can play an important role.  Because skin is not sterile, your skin needs to be as free of germs as possible.  You can reduce the number of germs on your skin by washing with CHG (chlorahexidine gluconate) soap before surgery.  CHG is an antiseptic cleaner which kills germs and bonds with the skin to continue killing germs even after washing. Please DO NOT use if you have an allergy to CHG or antibacterial soaps.  If your skin becomes reddened/irritated stop using the CHG and inform your nurse when you arrive at Short Stay. Do not shave (including legs and underarms) for at least 48 hours prior to the first CHG shower.  You may shave your face/neck.  Please follow these instructions carefully:  1.  Shower with CHG Soap the night before surgery and the morning of surgery.  2.  If you choose to wash your hair, wash your hair first as usual with your normal  shampoo.  3.  After you shampoo, rinse your hair and body  thoroughly to remove the shampoo.                             4.  Use CHG as you would any other liquid soap.  You can apply chg directly to the skin and wash.  Gently with a scrungie or clean washcloth.  5.  Apply the CHG Soap to your body ONLY FROM THE NECK DOWN.   Do   not use on face/ open                           Wound or open sores. Avoid contact with eyes, ears mouth and   genitals (private parts).                       Wash face,  Genitals (private parts) with your normal soap.             6.  Wash thoroughly, paying special attention to the area where your    surgery  will be performed.  7.  Thoroughly rinse your body with warm water from the neck down.  8.  DO NOT shower/wash with your normal soap after using and rinsing off the CHG Soap.                9.  Pat yourself dry with a clean towel.            10.  Wear clean pajamas.            11.  Place clean sheets on your bed the night of your first shower and do not  sleep with pets. Day of Surgery : Do not apply any CHG, lotions/deodorants the morning of surgery.  Please wear clean clothes to the hospital/surgery center.  FAILURE TO FOLLOW THESE INSTRUCTIONS MAY RESULT IN THE CANCELLATION OF YOUR SURGERY  PATIENT SIGNATURE_________________________________  NURSE SIGNATURE__________________________________  ________________________________________________________________________

## 2024-10-23 ENCOUNTER — Encounter (HOSPITAL_COMMUNITY)
Admission: RE | Admit: 2024-10-23 | Discharge: 2024-10-23 | Disposition: A | Discharge status: Home / Self Care | Source: Ambulatory Visit | Attending: Urology | Admitting: Urology

## 2024-10-23 ENCOUNTER — Encounter (HOSPITAL_COMMUNITY): Payer: Self-pay

## 2024-10-23 ENCOUNTER — Other Ambulatory Visit: Payer: Self-pay

## 2024-10-23 VITALS — BP 169/96 | HR 74 | Temp 98.4°F | Resp 16 | Ht 66.5 in | Wt 200.0 lb

## 2024-10-23 DIAGNOSIS — I10 Essential (primary) hypertension: Secondary | ICD-10-CM | POA: Insufficient documentation

## 2024-10-23 DIAGNOSIS — Z01818 Encounter for other preprocedural examination: Secondary | ICD-10-CM

## 2024-10-23 DIAGNOSIS — Z01812 Encounter for preprocedural laboratory examination: Secondary | ICD-10-CM | POA: Diagnosis present

## 2024-10-23 LAB — CBC
HCT: 37.5 % — ABNORMAL LOW (ref 39.0–52.0)
Hemoglobin: 11.9 g/dL — ABNORMAL LOW (ref 13.0–17.0)
MCH: 26 pg (ref 26.0–34.0)
MCHC: 31.7 g/dL (ref 30.0–36.0)
MCV: 81.9 fL (ref 80.0–100.0)
Platelets: 316 K/uL (ref 150–400)
RBC: 4.58 MIL/uL (ref 4.22–5.81)
RDW: 15.7 % — ABNORMAL HIGH (ref 11.5–15.5)
WBC: 12.3 K/uL — ABNORMAL HIGH (ref 4.0–10.5)
nRBC: 0 % (ref 0.0–0.2)

## 2024-10-23 LAB — HEMOGLOBIN A1C
Hgb A1c MFr Bld: 7 % — ABNORMAL HIGH (ref 4.8–5.6)
Mean Plasma Glucose: 154.2 mg/dL

## 2024-10-23 LAB — BASIC METABOLIC PANEL WITH GFR
Anion gap: 12 (ref 5–15)
BUN: 21 mg/dL — ABNORMAL HIGH (ref 6–20)
CO2: 26 mmol/L (ref 22–32)
Calcium: 9.4 mg/dL (ref 8.9–10.3)
Chloride: 102 mmol/L (ref 98–111)
Creatinine, Ser: 1.4 mg/dL — ABNORMAL HIGH (ref 0.61–1.24)
GFR, Estimated: 59 mL/min — ABNORMAL LOW (ref >60)
Glucose, Bld: 211 mg/dL — ABNORMAL HIGH (ref 70–99)
Potassium: 3.4 mmol/L — ABNORMAL LOW (ref 3.5–5.1)
Sodium: 140 mmol/L (ref 135–145)

## 2024-10-23 LAB — GLUCOSE, CAPILLARY: Glucose-Capillary: 217 mg/dL — ABNORMAL HIGH (ref 70–99)

## 2024-10-23 NOTE — Anesthesia Preprocedure Evaluation (Signed)
 Anesthesia Evaluation    Reviewed: Allergy & Precautions, Patient's Chart, lab work & pertinent test results  Airway        Dental   Pulmonary neg pulmonary ROS, Current Smoker and Patient abstained from smoking.          Cardiovascular hypertension, Pt. on medications      Neuro/Psych negative neurological ROS  negative psych ROS   GI/Hepatic negative GI ROS, Neg liver ROS,,,  Endo/Other  negative endocrine ROS    Renal/GU Renal InsufficiencyRenal disease  negative genitourinary   Musculoskeletal negative musculoskeletal ROS (+)    Abdominal   Peds  Hematology negative hematology ROS (+)   Anesthesia Other Findings   Reproductive/Obstetrics                              Anesthesia Physical Anesthesia Plan  ASA: 2  Anesthesia Plan: General   Post-op Pain Management: Tylenol  PO (pre-op)*   Induction: Intravenous  PONV Risk Score and Plan: 1 and Ondansetron , Dexamethasone  and Midazolam   Airway Management Planned: LMA  Additional Equipment:   Intra-op Plan:   Post-operative Plan: Extubation in OR  Informed Consent: I have reviewed the patients History and Physical, chart, labs and discussed the procedure including the risks, benefits and alternatives for the proposed anesthesia with the patient or authorized representative who has indicated his/her understanding and acceptance.     Dental advisory given  Plan Discussed with: CRNA  Anesthesia Plan Comments:         Anesthesia Quick Evaluation

## 2024-10-24 ENCOUNTER — Ambulatory Visit (HOSPITAL_COMMUNITY)
Admission: RE | Admit: 2024-10-24 | Discharge: 2024-10-24 | Disposition: A | Discharge status: Home / Self Care | Attending: Urology | Admitting: Urology

## 2024-10-24 ENCOUNTER — Encounter: Admission: RE | Disposition: A | Payer: Self-pay | Attending: Urology

## 2024-10-24 ENCOUNTER — Ambulatory Visit (HOSPITAL_COMMUNITY)

## 2024-10-24 ENCOUNTER — Encounter (HOSPITAL_COMMUNITY): Payer: Self-pay | Admitting: Urology

## 2024-10-24 ENCOUNTER — Encounter (HOSPITAL_COMMUNITY): Payer: Self-pay | Admitting: Medical

## 2024-10-24 ENCOUNTER — Ambulatory Visit (HOSPITAL_BASED_OUTPATIENT_CLINIC_OR_DEPARTMENT_OTHER): Payer: Self-pay | Admitting: Anesthesiology

## 2024-10-24 DIAGNOSIS — Z8042 Family history of malignant neoplasm of prostate: Secondary | ICD-10-CM | POA: Diagnosis not present

## 2024-10-24 DIAGNOSIS — N135 Crossing vessel and stricture of ureter without hydronephrosis: Secondary | ICD-10-CM | POA: Diagnosis present

## 2024-10-24 DIAGNOSIS — I10 Essential (primary) hypertension: Secondary | ICD-10-CM | POA: Diagnosis not present

## 2024-10-24 DIAGNOSIS — C61 Malignant neoplasm of prostate: Secondary | ICD-10-CM | POA: Diagnosis not present

## 2024-10-24 DIAGNOSIS — C799 Secondary malignant neoplasm of unspecified site: Secondary | ICD-10-CM | POA: Insufficient documentation

## 2024-10-24 DIAGNOSIS — C669 Malignant neoplasm of unspecified ureter: Secondary | ICD-10-CM

## 2024-10-24 DIAGNOSIS — Z87891 Personal history of nicotine dependence: Secondary | ICD-10-CM | POA: Diagnosis not present

## 2024-10-24 HISTORY — PX: CYSTOSCOPY W/ URETERAL STENT PLACEMENT: SHX1429

## 2024-10-24 HISTORY — PX: CYSTOSCOPY W/ RETROGRADES: SHX1426

## 2024-10-24 LAB — GLUCOSE, CAPILLARY: Glucose-Capillary: 137 mg/dL — ABNORMAL HIGH (ref 70–99)

## 2024-10-24 SURGERY — CYSTOSCOPY, FLEXIBLE, WITH STENT REPLACEMENT
Anesthesia: General | Site: Penis | Laterality: Bilateral

## 2024-10-24 MED ORDER — OXYCODONE HCL 5 MG/5ML PO SOLN: 5.0000 mg | Freq: Once | ORAL | Status: AC | PRN

## 2024-10-24 MED ORDER — SODIUM CHLORIDE 0.9 % IR SOLN
Status: DC | PRN
  Administered 2024-10-24: 3000 mL via INTRAVESICAL

## 2024-10-24 MED ORDER — TRAMADOL HCL 50 MG PO TABS: 50.0000 mg | ORAL_TABLET | Freq: Four times a day (QID) | ORAL | 0 refills | Status: AC | PRN

## 2024-10-24 MED ORDER — OXYCODONE HCL 5 MG PO TABS
5.0000 mg | ORAL_TABLET | Freq: Once | ORAL | Status: AC | PRN
  Administered 2024-10-24: 5 mg via ORAL

## 2024-10-24 MED ORDER — MIDAZOLAM HCL 2 MG/2ML IJ SOLN
INTRAMUSCULAR | Status: AC
  Filled 2024-10-24: qty 2

## 2024-10-24 MED ORDER — FENTANYL CITRATE (PF) 50 MCG/ML IJ SOSY: 25.0000 ug | PREFILLED_SYRINGE | INTRAMUSCULAR | Status: DC | PRN

## 2024-10-24 MED ORDER — PHENYLEPHRINE 80 MCG/ML (10ML) SYRINGE FOR IV PUSH (FOR BLOOD PRESSURE SUPPORT)
PREFILLED_SYRINGE | INTRAVENOUS | Status: DC | PRN
  Administered 2024-10-24: 160 ug via INTRAVENOUS

## 2024-10-24 MED ORDER — FENTANYL CITRATE (PF) 100 MCG/2ML IJ SOLN
INTRAMUSCULAR | Status: AC
  Filled 2024-10-24: qty 2

## 2024-10-24 MED ORDER — LIDOCAINE HCL (PF) 2 % IJ SOLN
INTRAMUSCULAR | Status: AC
  Filled 2024-10-24: qty 5

## 2024-10-24 MED ORDER — PROPOFOL 10 MG/ML IV BOLUS
INTRAVENOUS | Status: DC | PRN
  Administered 2024-10-24: 150 mg via INTRAVENOUS

## 2024-10-24 MED ORDER — DEXAMETHASONE SOD PHOSPHATE PF 10 MG/ML IJ SOLN
INTRAMUSCULAR | Status: DC | PRN
  Administered 2024-10-24: 10 mg via INTRAVENOUS

## 2024-10-24 MED ORDER — ACETAMINOPHEN 500 MG PO TABS
1000.0000 mg | ORAL_TABLET | Freq: Once | ORAL | Status: AC
  Administered 2024-10-24: 1000 mg via ORAL
  Filled 2024-10-24: qty 2

## 2024-10-24 MED ORDER — CHLORHEXIDINE GLUCONATE 0.12 % MT SOLN
15.0000 mL | Freq: Once | OROMUCOSAL | Status: AC
  Administered 2024-10-24: 15 mL via OROMUCOSAL

## 2024-10-24 MED ORDER — LIDOCAINE HCL (PF) 2 % IJ SOLN
INTRAMUSCULAR | Status: DC | PRN
  Administered 2024-10-24: 100 mg via INTRADERMAL

## 2024-10-24 MED ORDER — SODIUM CHLORIDE 0.9 % IV SOLN
1.0000 g | INTRAVENOUS | Status: AC
  Administered 2024-10-24: 1 g via INTRAVENOUS
  Filled 2024-10-24: qty 10

## 2024-10-24 MED ORDER — MIDAZOLAM HCL 5 MG/5ML IJ SOLN
INTRAMUSCULAR | Status: DC | PRN
  Administered 2024-10-24: 2 mg via INTRAVENOUS

## 2024-10-24 MED ORDER — AMISULPRIDE (ANTIEMETIC) 5 MG/2ML IV SOLN: 10.0000 mg | Freq: Once | INTRAVENOUS | Status: DC | PRN

## 2024-10-24 MED ORDER — ONDANSETRON HCL 4 MG/2ML IJ SOLN
INTRAMUSCULAR | Status: DC | PRN
  Administered 2024-10-24: 4 mg via INTRAVENOUS

## 2024-10-24 MED ORDER — OXYCODONE HCL 5 MG PO TABS
ORAL_TABLET | ORAL | Status: AC
  Filled 2024-10-24: qty 1

## 2024-10-24 MED ORDER — IOHEXOL 300 MG/ML  SOLN
INTRAMUSCULAR | Status: DC | PRN
  Administered 2024-10-24: 30 mL via URETHRAL

## 2024-10-24 MED ORDER — ORAL CARE MOUTH RINSE: 15.0000 mL | Freq: Once | OROMUCOSAL | Status: AC

## 2024-10-24 MED ORDER — FENTANYL CITRATE (PF) 100 MCG/2ML IJ SOLN
INTRAMUSCULAR | Status: DC | PRN
  Administered 2024-10-24 (×2): 50 ug via INTRAVENOUS

## 2024-10-24 MED ORDER — ONDANSETRON HCL 4 MG/2ML IJ SOLN
INTRAMUSCULAR | Status: AC
  Filled 2024-10-24: qty 2

## 2024-10-24 MED ORDER — LACTATED RINGERS IV SOLN: INTRAVENOUS | Status: DC

## 2024-10-24 SURGICAL SUPPLY — 16 items
BAG URO CATCHER STRL LF (MISCELLANEOUS) ×1 IMPLANT
BASKET ZERO TIP NITINOL 2.4FR (BASKET) IMPLANT
CATH URETL OPEN END 6FR 70 (CATHETERS) IMPLANT
CLOTH BEACON ORANGE TIMEOUT ST (SAFETY) ×1 IMPLANT
GLOVE SURG LX STRL 7.5 STRW (GLOVE) ×1 IMPLANT
GOWN STRL REUS W/ TWL XL LVL3 (GOWN DISPOSABLE) ×1 IMPLANT
GUIDEWIRE ANG ZIPWIRE 038X150 (WIRE) ×1 IMPLANT
GUIDEWIRE STR DUAL SENSOR (WIRE) ×1 IMPLANT
KIT TURNOVER KIT A (KITS) ×1 IMPLANT
MANIFOLD NEPTUNE II (INSTRUMENTS) ×1 IMPLANT
NS IRRIG 1000ML POUR BTL (IV SOLUTION) IMPLANT
PACK CYSTO (CUSTOM PROCEDURE TRAY) ×1 IMPLANT
STENT URET 6FRX22 CONTOUR (STENTS) IMPLANT
TUBE PU 8FR 16IN ENFIT (TUBING) IMPLANT
TUBING CONNECTING 10 (TUBING) ×1 IMPLANT
TUBING UROLOGY SET (TUBING) IMPLANT

## 2024-10-24 NOTE — Transfer of Care (Signed)
 Immediate Anesthesia Transfer of Care Note  Patient: Adam Mcguire  Procedure(s) Performed: PHYLLIS SIDE, WITH STENT REPLACEMENT (Bilateral: Penis) CYSTOSCOPY, WITH RETROGRADE PYELOGRAM (Bilateral: Penis)  Patient Location: PACU  Anesthesia Type:General  Level of Consciousness: sedated  Airway & Oxygen Therapy: Patient Spontanous Breathing and Patient connected to face mask oxygen  Post-op Assessment: Report given to RN and Post -op Vital signs reviewed and stable  Post vital signs: Reviewed and stable  Last Vitals:  Vitals Value Taken Time  BP 135/86 10/24/24 07:51  Temp    Pulse 58 10/24/24 07:53  Resp 11 10/24/24 07:53  SpO2 97 % 10/24/24 07:53  Vitals shown include unfiled device data.  Last Pain:  Vitals:   10/24/24 0641  TempSrc:   PainSc: 0-No pain         Complications: No notable events documented.

## 2024-10-24 NOTE — H&P (Signed)
 Adam Mcguire is an 58 y.o. male.    Chief Complaint: Pre-OP Cysto / Bilateral Ureteral Stent Exchange  HPI:    1 - Metastatic Prostate Cancer - Initial DX: 12/12 cores up to 95% grade 5 cancer on BX 12/2203 on eval PSA 213. Brother with prostate cancer. DRE 09/2023 with Rt side nodular and fixed. 11/2023 TRUS BX 51 mL, tiny median, very fixed. Primary TX: androgen deprivation + palliative TURP 2025 Additional TX: Xtandi 160 QD ([facility]) Most REcetn Restaging: CT 12/2023 shoddy <2cm adenopathy from aortoa to pelvis.  Recent Course: 01/2024 - Firmagon  240mg  while in hospital, plan to start Xtandi. 03/2024 - ELigard 45 04/2024 - PSA 3.6, T<10, CMP Nl 09/2024 - PSA 0.8, T 9, AP 124, Cr 1.4 ==> Eligard 45  2- Gross Hematuria - New gross hematuira 08/2023. 40PY smoker. STI screen negative at PCP. Very elevated PSA as per above. CT 11/2023 with bilat hydro to bladder, no upper tract masses. Cysto at time of TURP only with prostate vascularity.  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 Ct 11/2023 with bilateral hydro to bladder neck / prostate. UO's not invaded but significant extrinsic distal compression from proastate cancer ==> 6x22 stents 01/2024.  4 - Lower Urinary Tract Symptoms / Incomplete Bladder Emptying - progressive bother from irritative and obstructive LUTS. PVR 2024. DRE 40gm. Starting tamsulosin  and now s/p TURP.   PMH sig for open appy. 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 HIs PCP is Adam Free NP with [facility]  Today Adam Mcguire is seen to proceed with bilateral ureteral stent exchange v. Removal. NO interval fevers. Cr 1.4, Hgb 11.9, A1c 7 most recently. No interval fevers.   Past Medical History:  Diagnosis Date   Cancer [facility])    History of kidney stones    Hypertension    Kidney stone 11/07/2003    Past Surgical  History:  Procedure Laterality Date   APPENDECTOMY     CYSTOSCOPY W/ RETROGRADES Bilateral 01/30/2024   Procedure: CYSTOSCOPY, WITH RETROGRADE PYELOGRAM WITH BILATERAL STENT PLACEMENT;  Surgeon: Alvaro Ricardo KATHEE Mickey., MD;  Location: [facility];  Service: Urology;  Laterality: Bilateral;   TIBIA FRACTURE SURGERY     pulled 16 sheets of sheet rock- crushed right ankle and left ankle and knee cap on left   TRANSURETHRAL RESECTION OF PROSTATE N/A 01/30/2024   Procedure: TURP (TRANSURETHRAL RESECTION OF PROSTATE);  Surgeon: Alvaro Ricardo KATHEE Mickey., MD;  Location: [facility];  Service: Urology;  Laterality: N/A;   VASECTOMY      Family History  Problem Relation Age of Onset   Healthy Mother        as far as hes aware   CAD Father        age 31   Healthy Sister        healthy as far as hes aware in 69s in [place]    Colon cancer Brother    Hypertension Brother    Social History:  reports that he has been smoking cigarettes. He has a 20 pack-year smoking history. He has never used smokeless tobacco. He reports that he does not currently use drugs after having used the following drugs: Marijuana. He reports that he does not drink alcohol.  Allergies: Allergies[1]  Medications Prior to Admission  Medication Sig Dispense Refill   acetaminophen  (TYLENOL ) 500 MG tablet Take  1,000 mg by mouth 2 (two) times daily as needed for headache or fever (pain).     amLODipine  (NORVASC ) 10 MG tablet Take 1 tablet (10 mg total) by mouth daily. (Patient taking differently: Take 10 mg by mouth at bedtime.) 90 tablet 3   predniSONE (DELTASONE) 5 MG tablet Take 10 mg by mouth at bedtime.     abiraterone acetate (ZYTIGA) 250 MG tablet Take 1,000 mg by mouth 2 (two) times daily. (Patient not taking: Reported on 10/23/2024)      Results for orders placed or performed during the hospital encounter of 10/24/24 (from the past 48 hours)  Glucose, capillary     Status: Abnormal   Collection Time: 10/24/24  6:07 AM  Result  Value Ref Range   Glucose-Capillary 137 (H) 70 - 99 mg/dL    Comment: Glucose reference range applies only to samples taken after fasting for at least 8 hours.   No results found.  Review of Systems  Constitutional:  Negative for chills and fever.  Genitourinary:  Positive for hematuria.  All other systems reviewed and are negative.   Blood pressure (!) 153/98, pulse 68, temperature 98.7 F (37.1 C), temperature source Oral, resp. rate 16, SpO2 97%. Physical Exam Vitals reviewed.  HENT:     Head: Normocephalic.  Eyes:     Pupils: Pupils are equal, round, and reactive to light.  Cardiovascular:     Rate and Rhythm: Normal rate.  Pulmonary:     Effort: Pulmonary effort is normal.  Abdominal:     General: Abdomen is flat.  Genitourinary:    Comments: No CVAT at present Musculoskeletal:        General: Normal range of motion.     Cervical back: Normal range of motion.  Skin:    General: Skin is warm.  Neurological:     General: No focal deficit present.     Mental Status: He is alert.  Psychiatric:        Mood and Affect: Mood normal.      Assessment/Plan  Proceed as planned with cysto, bilateral retrogrades, possible stent exchange v. Removal for malignant obstruction. Risks, benefits, alternatives, expected peri-op course discussed previously and reiterated today.   Ricardo KATHEE Alvaro Mickey., MD 10/24/2024, 6:33 AM       [1]  Allergies Allergen Reactions   Vicodin [Hydrocodone -Acetaminophen ] Nausea And Vomiting    Reaction to hydrocodone  - OK to take Tylenol /acetaminophen .

## 2024-10-24 NOTE — Brief Op Note (Signed)
 10/24/2024  7:45 AM  PATIENT:  Adam Mcguire  58 y.o. male  PRE-OPERATIVE DIAGNOSIS:  BILATERAL MALIGNANT URETERAL OBSTRUCTION:  POST-OPERATIVE DIAGNOSIS:  BILATERAL MALIGNANT URETERAL OBSTRUCTION:  PROCEDURE:  Procedures: CYSTOSCOPY, FLEXIBLE, WITH STENT REPLACEMENT (Bilateral) CYSTOSCOPY, WITH RETROGRADE PYELOGRAM (Bilateral)  SURGEON:  Surgeons and Role:    * Manny, Ricardo KATHEE Raddle., MD - Primary  PHYSICIAN ASSISTANT:   ASSISTANTS: none   ANESTHESIA:   general  EBL:  minimal   BLOOD ADMINISTERED:none  DRAINS: none   LOCAL MEDICATIONS USED:  NONE  SPECIMEN:  Source of Specimen:  bilateral ureteral stents  DISPOSITION OF SPECIMEN:  discard  COUNTS:  YES  TOURNIQUET:  * No tourniquets in log *  DICTATION: .Other Dictation: Dictation Number [id number]  PLAN OF CARE: Discharge to home after PACU  PATIENT DISPOSITION:  PACU - hemodynamically stable.   Delay start of Pharmacological VTE agent (>24hrs) due to surgical blood loss or risk of bleeding: not applicable

## 2024-10-24 NOTE — Op Note (Signed)
 NAMENIKODEM, LEADBETTER MEDICAL RECORD NO: [id number] ACCOUNT NO: [id number] DATE OF BIRTH: February 12, 1966 FACILITY: [facility] LOCATION: [facility] PHYSICIAN: Ricardo Likens, MD  Operative Report   DATE OF PROCEDURE: 10/24/2024  PREOPERATIVE DIAGNOSIS:  Malignant ureteral obstruction.  PROCEDURE PERFORMED: 1.  Cystoscopy with bilateral retrograde pyelograms, interpretation. 2.  Exchange of bilateral ureteral stents.  ESTIMATED BLOOD LOSS:  Nil.  COMPLICATIONS:  None.  SPECIMENS:  Bilateral stents for discard.  FINDINGS:   1.  Persistent but improved bilateral malignant obstruction mostly in the distal third, mid third of the ureter. 2.  Significant incrustation on bilateral stents.  Suggest 70-month intervals going forwards for stent exchanges. 3.  Wide open prostatic fossa consistent with a prior transurethral resection.  INDICATIONS:  The patient is a very pleasant 58 year old man with history of metastatic prostate cancer with malignant obstruction.  He is status post stenting in 01/2024 to preserve his renal function, that has worked quite well.  He has had a dramatic  biochemical response to systemic therapy for his prostate cancer.  Options were discussed for further management of his ureteral obstruction including recommended the path of operative bilateral stent exchange versus removal pending intraoperative  findings.  He presents for this today.  Informed consent was obtained and placed in the medical record.  DESCRIPTION OF PROCEDURE:  The patient being verified, procedure being cystoscopy, bilateral stent exchange versus removal was confirmed.  Procedure timeout was performed.  Intravenous antibiotics administered.  General LMA anesthesia was induced.  The  patient was placed into a low lithotomy position.  Sterile field was created.  Prepping and draping the patient's penis, perineum and proximal thigh using iodine.  Urethra was somewhat narrow in caliber and was calibrated from 12  French to 24 French  using male sounds which then allowed very easy passage of the 21-French rigid cystoscope.  Inspection of the anterior and posterior urethra then only revealed a wide open prostatic fossa consistent with prior transurethral resection.  Inspection of the  urinary bladder revealed no diverticula, calcifications, or papillary lesions.  Distal end of bilateral stents  in situ.  There was mild to moderate incrustation noted.  The distal end of the right stent was grasped, it was brought out in its  entirety, set aside for discard.  Next, the distal end of the left stent was grasped and it was then brought out in its entirety, set aside for discard.  The right ureteral orifice was cannulated with a 6-French end-hole catheter and a right retrograde  pyelogram was obtained.  Right retrograde pyelogram demonstrated a single right ureter, single system right kidney.  There was a shoddy appearance to the ureter, especially in the mid portion, consistent with likely persistent malignant obstruction and there was some mild  hydronephrosis noted.  This was felt to be improved over prior but certainly persistent and warrants stent exchange.  A 0.038 ZIPwire was advanced and a new 6 x 22 Contour type stent was carefully placed using cystoscopic and fluoroscopic guidance.  Good  proximal and distal plane were noted.  Next a left retrograde pyelogram was obtained.  Left retrograde pyelogram demonstrated a single left ureter, single system left kidney.  There was even less obstructive signs on the left, however, was persistent and again it was felt that stent exchange would be most prudent.  The ZIPwire was once  again advanced and a separate 6 x 22 Contour type stent was carefully placed using cystoscopic and fluoroscopic guidance.  Good proximal and distal plane  were noted.  The bladder was emptied per cystoscope and the procedure was then terminated.  The  patient tolerated the procedure well with no  immediate periprocedural complications.  The patient was taken to postanesthesia care unit in stable condition.  Plan is for discharge home.  We will plan for next stent exchange at approximately 6 months.   Hopefully his systemic therapy will reduce his tumor burden enough that he can eventually get stent free.   SHW D: 10/24/2024 7:53:34 am T: 10/24/2024 8:29:00 am  JOB: [id number]/ [id number]

## 2024-10-24 NOTE — Progress Notes (Signed)
 Pt daughter, Alan called this morning inquiring about her father's surgery. Pt daughter very upset that her father did not make her aware that he was having surgery and was inquiring as to what his condition is. I advised that staff are to remain HIPAA compliant and I am happy to have her father contact her when he is able. She declined.

## 2024-10-24 NOTE — Anesthesia Procedure Notes (Signed)
 Procedure Name: LMA Insertion Date/Time: 10/24/2024 7:20 AM  Performed by: Carleton Garnette SAUNDERS, CRNAPre-anesthesia Checklist: Patient identified, Emergency Drugs available, Suction available, Patient being monitored and Timeout performed Patient Re-evaluated:Patient Re-evaluated prior to induction Oxygen Delivery Method: Circle system utilized Preoxygenation: Pre-oxygenation with 100% oxygen Induction Type: IV induction LMA: LMA inserted LMA Size: 4.0 Tube type: Oral Number of attempts: 1 Placement Confirmation: positive ETCO2 and breath sounds checked- equal and bilateral Tube secured with: Tape Dental Injury: Teeth and Oropharynx as per pre-operative assessment

## 2024-10-24 NOTE — Discharge Instructions (Signed)
 1 - You may have urinary urgency (bladder spasms) and bloody urine on / off with stent in place. This is normal.  2 - Call MD or go to ER for fever >102, severe pain / nausea / vomiting not relieved by medications, or acute change in medical status

## 2024-10-24 NOTE — Anesthesia Postprocedure Evaluation (Signed)
"   Anesthesia Post Note  Patient: Adam Mcguire  Procedure(s) Performed: CYSTOSCOPY, FLEXIBLE, WITH STENT REPLACEMENT (Bilateral: Penis) CYSTOSCOPY, WITH RETROGRADE PYELOGRAM (Bilateral: Penis)     Patient location during evaluation: PACU Anesthesia Type: General Level of consciousness: awake and alert Pain management: pain level controlled Vital Signs Assessment: post-procedure vital signs reviewed and stable Respiratory status: spontaneous breathing, nonlabored ventilation, respiratory function stable and patient connected to nasal cannula oxygen Cardiovascular status: blood pressure returned to baseline and stable Postop Assessment: no apparent nausea or vomiting Anesthetic complications: no   No notable events documented.  Last Vitals:  Vitals:   10/24/24 0830 10/24/24 0845  BP: 139/76 (!) 175/90  Pulse: (!) 52 60  Resp: 14 20  Temp:  36.4 C  SpO2: 98% 98%    Last Pain:  Vitals:   10/24/24 0845  TempSrc:   PainSc: 0-No pain                 [name]      "

## 2024-10-25 ENCOUNTER — Encounter (HOSPITAL_COMMUNITY): Payer: Self-pay | Admitting: Urology
